# Patient Record
Sex: Male | Born: 1955 | Race: White | Hispanic: No | Marital: Married | Smoking: Former smoker
Health system: Southern US, Community
[De-identification: ages and names within clinical notes are randomized; demographics above are authoritative.]

## PROBLEM LIST (undated history)

## (undated) DIAGNOSIS — E785 Hyperlipidemia, unspecified: Secondary | ICD-10-CM

## (undated) DIAGNOSIS — J309 Allergic rhinitis, unspecified: Secondary | ICD-10-CM

## (undated) HISTORY — DX: Allergic rhinitis, unspecified: J30.9

## (undated) HISTORY — DX: Hyperlipidemia, unspecified: E78.5

## (undated) HISTORY — PX: WISDOM TOOTH EXTRACTION: SHX21

---

## 2001-01-25 ENCOUNTER — Other Ambulatory Visit: Admission: RE | Admit: 2001-01-25 | Discharge: 2001-01-25 | Payer: Self-pay | Admitting: Urology

## 2004-04-26 HISTORY — PX: CYST REMOVAL NECK: SHX6281

## 2011-01-18 ENCOUNTER — Ambulatory Visit (INDEPENDENT_AMBULATORY_CARE_PROVIDER_SITE_OTHER): Payer: BC Managed Care – PPO | Admitting: *Deleted

## 2011-01-18 DIAGNOSIS — M7989 Other specified soft tissue disorders: Secondary | ICD-10-CM

## 2011-01-21 NOTE — Procedures (Unsigned)
DUPLEX DEEP VENOUS EXAM - LOWER EXTREMITY  INDICATION:  Rule out DVT.  HISTORY:  Edema:  Yes. Trauma/Surgery:  No. Pain:  No. PE:  No. Previous DVT:  No. Anticoagulants:  No. Other:  Cellulitis and redness of the leg.  DUPLEX EXAM:               CFV   SFV   PopV  PTV    GSV               R  L  R  L  R  L  R   L  R  L Thrombosis    o  o     o     o      o     o Spontaneous   +  +     +     +      +     + Phasic        +  +     +     +      +     + Augmentation  +  +     +     +      +     + Compressible  +  +     +     +      +     + Competent     +  +     +     +      +     +  Legend:  + - yes  o - no  p - partial  D - decreased  IMPRESSION:  No evidence of deep venous thrombosis was noted.  Preliminary results were called to Dr. Laurey Morale office.   _____________________________ Di Kindle. Edilia Bo, M.D.  EM/MEDQ  D:  01/18/2011  T:  01/18/2011  Job:  [id number]

## 2011-08-25 HISTORY — PX: TRANSTHORACIC ECHOCARDIOGRAM: SHX275

## 2012-12-20 LAB — IFOBT (OCCULT BLOOD): IFOBT: NEGATIVE

## 2013-07-19 ENCOUNTER — Encounter: Payer: Self-pay | Admitting: *Deleted

## 2013-07-24 ENCOUNTER — Encounter: Payer: Self-pay | Admitting: Internal Medicine

## 2013-07-25 ENCOUNTER — Ambulatory Visit (INDEPENDENT_AMBULATORY_CARE_PROVIDER_SITE_OTHER): Payer: BC Managed Care – PPO | Admitting: Internal Medicine

## 2013-07-25 ENCOUNTER — Encounter: Payer: Self-pay | Admitting: Internal Medicine

## 2013-07-25 VITALS — BP 108/72 | HR 50 | Ht 73.0 in | Wt 179.8 lb

## 2013-07-25 DIAGNOSIS — R9431 Abnormal electrocardiogram [ECG] [EKG]: Secondary | ICD-10-CM

## 2013-07-25 DIAGNOSIS — I2510 Atherosclerotic heart disease of native coronary artery without angina pectoris: Secondary | ICD-10-CM

## 2013-07-25 DIAGNOSIS — R931 Abnormal findings on diagnostic imaging of heart and coronary circulation: Secondary | ICD-10-CM

## 2013-07-25 DIAGNOSIS — E785 Hyperlipidemia, unspecified: Secondary | ICD-10-CM

## 2013-07-25 NOTE — Patient Instructions (Signed)
Your physician has requested that you have an exercise tolerance test. For further information please visit [email or web address]. Please also follow instruction sheet, as given.  Your physician recommends that you schedule a follow-up appointment in: 2-3 weeks with Dr. Debara Pickett

## 2013-07-26 ENCOUNTER — Telehealth (HOSPITAL_COMMUNITY): Payer: Self-pay

## 2013-07-31 ENCOUNTER — Ambulatory Visit (HOSPITAL_COMMUNITY)
Admission: RE | Admit: 2013-07-31 | Discharge: 2013-07-31 | Disposition: A | Discharge status: Home / Self Care | Payer: BC Managed Care – PPO | Source: Ambulatory Visit | Attending: Internal Medicine | Admitting: Internal Medicine

## 2013-07-31 DIAGNOSIS — R9431 Abnormal electrocardiogram [ECG] [EKG]: Secondary | ICD-10-CM

## 2013-07-31 DIAGNOSIS — R931 Abnormal findings on diagnostic imaging of heart and coronary circulation: Secondary | ICD-10-CM

## 2013-08-01 ENCOUNTER — Telehealth: Payer: Self-pay | Admitting: *Deleted

## 2013-08-01 ENCOUNTER — Encounter: Payer: Self-pay | Admitting: Internal Medicine

## 2013-08-01 DIAGNOSIS — E785 Hyperlipidemia, unspecified: Secondary | ICD-10-CM | POA: Insufficient documentation

## 2013-08-01 DIAGNOSIS — R931 Abnormal findings on diagnostic imaging of heart and coronary circulation: Secondary | ICD-10-CM | POA: Insufficient documentation

## 2013-08-01 NOTE — Progress Notes (Signed)
OFFICE NOTE  Chief Complaint:  Abnormal coronary calcium  Primary Care Physician: Jerlyn Ly, MD  HPI:  Connor Chung is a very pleasant 36 showed male kindly referred to me by Dr. Joylene Draft.  He has a history of dyslipidemia, a family history of coronary disease and dyslipidemia and recently has been having troubles with microscopic hematuria. He underwent a CT scan of the abdomen and pelvis on 06/12/2013 through [facility]. This did not demonstrate any significant abnormalities in the kidneys, however there was coronary atherosclerosis including calcification of the right coronary artery which was incidentally noted. Apparently Connor Chung has a history of septal Q waves seen on his ECG and underwent an echocardiogram which was performed in our office on 09/17/2011. This demonstrated normal systolic and diastolic function. There was mild mitral regurgitation and mild left atrial enlargement. There is borderline aortic root dilatation at 3.9 cm.  He is on Zocor 40 mg daily for his dyslipidemia which is well controlled. His LDL particle number is 965, HDL PD 38-LDL size 21 nmol, total cholesterol 173, triglycerides 71, HDL 55 and LDL 104..  he is active and asymptomatic, denying any chest pain or shortness of breath or limitations to activity.  PMHx:  Past Medical History  Diagnosis Date  . Hyperlipidemia     Past Surgical History  Procedure Laterality Date  . Wisdom tooth extraction    . Transthoracic echocardiogram  08/2011    EF=>55%, borderline LA enlargement, RA mildly dilated, mild MR, trace TR, borderline aortic root dilatation     FAMHx:  Family History  Problem Relation Age of Onset  . Cancer Mother     skin   . Cancer Father     skin   . Heart Problems Maternal Grandmother     SOCHx:   reports that he quit smoking about 25 years ago. He has never used smokeless tobacco. He reports that he drinks alcohol. He reports that he does not use illicit drugs.  ALLERGIES:   No Known Allergies  ROS: A comprehensive review of systems was negative except for: Genitourinary: positive for hematuria  HOME MEDS: Current Outpatient Prescriptions  Medication Sig Dispense Refill  . cholecalciferol (VITAMIN D) 1000 UNITS tablet Take 2,000 Units by mouth daily.      . fluticasone (FLONASE) 50 MCG/ACT nasal spray Place 2 sprays into both nostrils daily.      . Omega-3 Fatty Acids (FISH OIL) 1200 MG CAPS Take 1 capsule by mouth daily.      . simvastatin (ZOCOR) 40 MG tablet Take 40 mg by mouth daily.       No current facility-administered medications for this visit.    LABS/IMAGING: No results found for this or any previous visit (from the past 48 hour(s)). No results found.  VITALS: BP 108/72  Pulse 50  Ht 6\' 1"  (1.854 m)  Wt 179 lb 12.8 oz (81.557 kg)  BMI 23.73 kg/m2  EXAM: General appearance: alert and no distress Neck: no carotid bruit and no JVD Lungs: clear to auscultation bilaterally Heart: regular rate and rhythm, S1, S2 normal, no murmur, click, rub or gallop Abdomen: soft, non-tender; bowel sounds normal; no masses,  no organomegaly Extremities: extremities normal, atraumatic, no cyanosis or edema Pulses: 2+ and symmetric Skin: Skin color, texture, turgor normal. No rashes or lesions Neurologic: Grossly normal Psych: Mood, affect normal  EKG: Sinus bradycardia at 50, incomplete right bundle pattern  ASSESSMENT: 1. Abnormal right coronary artery calcium 2. Dyslipidemia-well controlled 3. 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   Connor Chung is asymptomatic from a cardiac standpoint. He does have right coronary artery calcium, but is well treated with regards to his dyslipidemia. It very well may be that his coronary artery calcium develop prior to aggressive treatment of his dyslipidemia. His EKG does not indicate any prior MI. I believe his echocardiogram is reassuring from 2013  with normal wall motion, systolic and diastolic dysfunction. A functional assessment with exercise stress testing to evaluate if his right coronary artery calcium is associated with any stenosis it is reasonable testing. If he does well and this is low risk, I would continue with aggressive medical therapy including targets for the secondary prevention of coronary disease.  Thank you so much for the kind referral.  Pixie Casino, MD, [facility] Attending Cardiologist [place] 08/01/2013, 7:04 PM

## 2013-08-01 NOTE — Telephone Encounter (Signed)
Left message with normal exercise tolerance test results. Reminded of OV 5/1

## 2013-08-24 ENCOUNTER — Telehealth: Payer: Self-pay | Admitting: Internal Medicine

## 2013-08-24 ENCOUNTER — Encounter: Payer: Self-pay | Admitting: Internal Medicine

## 2013-08-24 ENCOUNTER — Ambulatory Visit (INDEPENDENT_AMBULATORY_CARE_PROVIDER_SITE_OTHER): Payer: BC Managed Care – PPO | Admitting: Internal Medicine

## 2013-08-24 VITALS — BP 112/62 | HR 56 | Ht 73.0 in | Wt 182.0 lb

## 2013-08-24 DIAGNOSIS — R931 Abnormal findings on diagnostic imaging of heart and coronary circulation: Secondary | ICD-10-CM

## 2013-08-24 DIAGNOSIS — E785 Hyperlipidemia, unspecified: Secondary | ICD-10-CM

## 2013-08-24 DIAGNOSIS — I2510 Atherosclerotic heart disease of native coronary artery without angina pectoris: Secondary | ICD-10-CM

## 2013-08-24 NOTE — Patient Instructions (Signed)
Your physician recommends that you schedule a follow-up appointment as needed  

## 2013-08-24 NOTE — Telephone Encounter (Signed)
Have a question. Want to know does he have any limitations on his physical activities and may he go mountain climbing. Please Call   Thanks

## 2013-08-24 NOTE — Progress Notes (Signed)
OFFICE NOTE  Chief Complaint:  Abnormal coronary calcium  Primary Care Physician: Connor Ly, MD  HPI:  Connor Chung is a very pleasant 37 showed male kindly referred to me by Dr. Joylene Chung.  He has a history of dyslipidemia, a family history of coronary disease and dyslipidemia and recently has been having troubles with microscopic hematuria. He underwent a CT scan of the abdomen and pelvis on 06/12/2013 through [facility]. This did not demonstrate any significant abnormalities in the kidneys, however there was coronary atherosclerosis including calcification of the right coronary artery which was incidentally noted. Apparently Connor Chung has a history of septal Q waves seen on his ECG and underwent an echocardiogram which was performed in our office on 09/17/2011. This demonstrated normal systolic and diastolic function. There was mild mitral regurgitation and mild left atrial enlargement. There is borderline aortic root dilatation at 3.9 cm.  He is on Zocor 40 mg daily for his dyslipidemia which is well controlled. His LDL particle number is 965, HDL PD 38-LDL size 21 nmol, total cholesterol 173, triglycerides 71, HDL 55 and LDL 104..  he is active and asymptomatic, denying any chest pain or shortness of breath or limitations to activity.  Connor Chung returns today for followup of his exercise tolerance test. He did very well with the test exercising for 15 minutes and 17 METS. There were no EKG changes noted. He had no chest pain.  PMHx:  Past Medical History  Diagnosis Date  . Hyperlipidemia     Past Surgical History  Procedure Laterality Date  . Wisdom tooth extraction    . Transthoracic echocardiogram  08/2011    EF=>55%, borderline LA enlargement, RA mildly dilated, mild MR, trace TR, borderline aortic root dilatation     FAMHx:  Family History  Problem Relation Age of Onset  . Cancer Mother     skin   . Cancer Father     skin   . Heart Problems Maternal  Grandmother     SOCHx:   reports that he quit smoking about 25 years ago. He has never used smokeless tobacco. He reports that he drinks alcohol. He reports that he does not use illicit drugs.  ALLERGIES:  No Known Allergies  ROS: A comprehensive review of systems was negative except for: Genitourinary: positive for hematuria  HOME MEDS: Current Outpatient Prescriptions  Medication Sig Dispense Refill  . cholecalciferol (VITAMIN D) 1000 UNITS tablet Take 2,000 Units by mouth daily.      . fluticasone (FLONASE) 50 MCG/ACT nasal spray Place 2 sprays into both nostrils daily.      . Omega-3 Fatty Acids (FISH OIL) 1200 MG CAPS Take 1 capsule by mouth daily.      . simvastatin (ZOCOR) 40 MG tablet Take 40 mg by mouth daily.       No current facility-administered medications for this visit.    LABS/IMAGING: No results found for this or any previous visit (from the past 48 hour(s)). No results found.  VITALS: BP 112/62  Pulse 56  Ht 6\' 1"  (1.854 m)  Wt 182 lb (82.555 kg)  BMI 24.02 kg/m2  EXAM: deferred  EKG: deferred  ASSESSMENT: 1. Abnormal right coronary artery calcium - low risk Bruce treadmill stress test 2. Dyslipidemia-well controlled 3. Incomplete right bundle-not septal Q waves, which require Q waves to be present in V1-V3  PLAN: 1.   Connor Chung is asymptomatic from a cardiac standpoint. He does have right coronary artery calcium, but is  well treated with regards to his dyslipidemia. He was able to exercise on the Bruce treadmill stress test to a high level and was asymptomatic without any significant EKG changes concerning for ischemia. This is very reassuring. Advised to continue to aggressively treat his cholesterol and modify his risk factors, he will likely not have problems with coronary disease. I'm happy to see him back as needed, but I will return him to your excellent care.  Thank you so much for the kind referral.  Connor Casino, MD, [facility] Attending  Cardiologist [place] 08/24/2013, 9:12 AM

## 2013-08-24 NOTE — Telephone Encounter (Signed)
Message forwarded to Dr. Hilty.  

## 2013-08-26 NOTE — Telephone Encounter (Signed)
No restrictions whatsoever. That's why we got the stress test and it was negative.  Dr. Lemmie Evens

## 2013-08-27 NOTE — Telephone Encounter (Signed)
Returned call and informed pt per instructions by MD.  Pt verbalized understanding and agreed w/ plan.  

## 2013-10-25 NOTE — Telephone Encounter (Signed)
Encounter complete. 

## 2014-05-28 ENCOUNTER — Other Ambulatory Visit (HOSPITAL_COMMUNITY)
Admission: RE | Admit: 2014-05-28 | Discharge: 2014-05-28 | Disposition: A | Discharge status: Home / Self Care | Payer: BLUE CROSS/BLUE SHIELD | Source: Ambulatory Visit | Attending: Otolaryngology | Admitting: Otolaryngology

## 2014-05-28 DIAGNOSIS — D110 Benign neoplasm of parotid gland: Secondary | ICD-10-CM | POA: Diagnosis not present

## 2014-07-12 ENCOUNTER — Telehealth: Payer: Self-pay | Admitting: Internal Medicine

## 2014-07-12 NOTE — Telephone Encounter (Signed)
Ok .. Go ahead and schedule an appointment with me. Ok to [employer].  DR. Lemmie Evens

## 2014-07-12 NOTE — Telephone Encounter (Signed)
Forward to Dr Debara Pickett AND Eliezer Lofts RN Copy of EKG,not located on either fax machine

## 2014-07-12 NOTE — Telephone Encounter (Signed)
Pt called in stating that he would like to be seen by Dr. Debara Pickett as soon as possible because he was set to have GI surgery but once the surgeon took an EKG , it showed some irregularities with his heart beat and wanted him to be seen before proceeding with the surgery. Pt stated that he will be faxing over a copy of the EKG to the attn of [place]. Please f/u with pt  Thanks

## 2014-07-12 NOTE — Telephone Encounter (Signed)
Spoke with patient. He states he can come in to see Dr. Debara Pickett on Tuesday March 22nd - OK to double book.   Appointment scheduled for 3/22 @ 3pm - patient aware  Patient will bring EKG with him to appointment & will fax to our office

## 2014-07-15 ENCOUNTER — Telehealth (HOSPITAL_COMMUNITY): Payer: Self-pay | Admitting: Internal Medicine

## 2014-07-15 NOTE — Telephone Encounter (Signed)
Received records from [facility] for appointment with Dr Debara Pickett on 07/16/14.  Records given to [facility] (medical records) for Dr Lysbeth Penner schedule on 07/16/14. lp

## 2014-07-15 NOTE — Telephone Encounter (Signed)
Close encounter 

## 2014-07-15 NOTE — Telephone Encounter (Signed)
Patient notified that Dr. Debara Pickett reviewed EKG and it was same as last year but HR was slightly slower. Patient reports he feels he may be in better shape than he was last year. Patient will keep f/up on 3/22 for surgical clearance

## 2014-07-16 ENCOUNTER — Ambulatory Visit (INDEPENDENT_AMBULATORY_CARE_PROVIDER_SITE_OTHER): Payer: BLUE CROSS/BLUE SHIELD | Admitting: Internal Medicine

## 2014-07-16 ENCOUNTER — Encounter: Payer: Self-pay | Admitting: Internal Medicine

## 2014-07-16 VITALS — BP 116/80 | HR 64 | Ht 73.0 in | Wt 176.0 lb

## 2014-07-16 DIAGNOSIS — E785 Hyperlipidemia, unspecified: Secondary | ICD-10-CM

## 2014-07-16 DIAGNOSIS — R931 Abnormal findings on diagnostic imaging of heart and coronary circulation: Secondary | ICD-10-CM

## 2014-07-16 DIAGNOSIS — I2510 Atherosclerotic heart disease of native coronary artery without angina pectoris: Secondary | ICD-10-CM

## 2014-07-16 DIAGNOSIS — R001 Bradycardia, unspecified: Secondary | ICD-10-CM

## 2014-07-16 NOTE — Progress Notes (Signed)
OFFICE NOTE  Chief Complaint:  Abnormal coronary calcium  Primary Care Physician: Jerlyn Ly, MD  HPI:  Connor Chung is a very pleasant 67 showed male kindly referred to me by Dr. Joylene Draft.  He has a history of dyslipidemia, a family history of coronary disease and dyslipidemia and recently has been having troubles with microscopic hematuria. He underwent a CT scan of the abdomen and pelvis on 06/12/2013 through [facility]. This did not demonstrate any significant abnormalities in the kidneys, however there was coronary atherosclerosis including calcification of the right coronary artery which was incidentally noted. Apparently Connor Chung has a history of septal Q waves seen on his ECG and underwent an echocardiogram which was performed in our office on 09/17/2011. This demonstrated normal systolic and diastolic function. There was mild mitral regurgitation and mild left atrial enlargement. There is borderline aortic root dilatation at 3.9 cm.  He is on Zocor 40 mg daily for his dyslipidemia which is well controlled. His LDL particle number is 965, HDL PD 38-LDL size 21 nmol, total cholesterol 173, triglycerides 71, HDL 55 and LDL 104..  he is active and asymptomatic, denying any chest pain or shortness of breath or limitations to activity.  Connor Chung returns today for followup of his exercise tolerance test. He did very well with the test exercising for 15 minutes and 17 METS. There were no EKG changes noted. He had no chest pain.  Connor Chung was referred back to me today for evaluation of an abnormal EKG. He underwent preoperative testing and an EKG showed marked sinus bradycardia with a PAC. There was concern for possible septal infarct and incomplete right bundle branch block. I compared his EKG with his prior EKG from the office and there is essentially no change other than a heart rate slightly lower. It turns out that he's had some bradycardia in the past which is a general sinus  bradycardia. That being said, as above he exercised quite a lot on his exercise tolerance test without any symptoms. He continues to have no symptoms in fact he has better exercise tolerance now that he's been more regularly exercising that he had when I saw him previously.  PMHx:  Past Medical History  Diagnosis Date  . Hyperlipidemia     Past Surgical History  Procedure Laterality Date  . Wisdom tooth extraction    . Transthoracic echocardiogram  08/2011    EF=>55%, borderline LA enlargement, RA mildly dilated, mild MR, trace TR, borderline aortic root dilatation     FAMHx:  Family History  Problem Relation Age of Onset  . Cancer Mother     skin   . Cancer Father     skin   . Heart Problems Maternal Grandmother     SOCHx:   reports that he quit smoking about 26 years ago. He has never used smokeless tobacco. He reports that he drinks alcohol. He reports that he does not use illicit drugs.  ALLERGIES:  No Known Allergies  ROS: A comprehensive review of systems was negative.  HOME MEDS: Current Outpatient Prescriptions  Medication Sig Dispense Refill  . cholecalciferol (VITAMIN D) 1000 UNITS tablet Take 2,000 Units by mouth daily.    . fluticasone (FLONASE) 50 MCG/ACT nasal spray Place 2 sprays into both nostrils daily.    . Omega-3 Fatty Acids (FISH OIL) 1200 MG CAPS Take 1 capsule by mouth daily.    . simvastatin (ZOCOR) 40 MG tablet Take 40 mg by mouth daily.  No current facility-administered medications for this visit.    LABS/IMAGING: No results found for this or any previous visit (from the past 48 hour(s)). No results found.  VITALS: BP 116/80 mmHg  Pulse 64  Ht 6\' 1"  (1.854 m)  Wt 176 lb (79.833 kg)  BMI 23.23 kg/m2  EXAM: General appearance: alert and no distress Lungs: clear to auscultation bilaterally Heart: regular rate and rhythm, S1, S2 normal, no murmur, click, rub or gallop Extremities: extremities normal, atraumatic, no cyanosis or  edema  EKG: deferred  ASSESSMENT: 1. Abnormal right coronary artery calcium - low risk Bruce treadmill stress test 2. Dyslipidemia-well controlled 3. Incomplete right bundle-not septal Q waves, which require Q waves to be present in V1-V3 4. Asymptomatic sinus bradycardia 5. Low risk for upcoming surgery  PLAN: 1.   Connor Chung is asymptomatic from a cardiac standpoint. He does have right coronary artery calcium, but is well treated with regards to his dyslipidemia. He was able to exercise on the Bruce treadmill stress test to a high level and was asymptomatic without any significant EKG changes concerning for ischemia. He has had intermittent bradycardia which is suspect is due to vagal tone, but he has never been symptomatic. It's very unlikely that he'll ever require a pacemaker for this. From a cardiac standpoint, he is not at increased risk for surgery and I would categorize him as low risk for his upcoming surgery.  Thank you so much for the kind referral.  Pixie Casino, MD, [facility] Attending Cardiologist [facility]  Kysean Sweet C 07/16/2014, 5:24 PM

## 2014-07-16 NOTE — Patient Instructions (Signed)
Your physician recommends that you schedule a follow-up appointment as needed  

## 2014-07-17 ENCOUNTER — Encounter: Payer: Self-pay | Admitting: Internal Medicine

## 2014-10-21 ENCOUNTER — Encounter: Payer: Self-pay | Admitting: Gastroenterology

## 2014-10-25 HISTORY — PX: OTHER SURGICAL HISTORY: SHX169

## 2015-01-09 ENCOUNTER — Encounter: Payer: BLUE CROSS/BLUE SHIELD | Admitting: Gastroenterology

## 2015-02-17 ENCOUNTER — Ambulatory Visit (AMBULATORY_SURGERY_CENTER): Payer: Self-pay | Admitting: *Deleted

## 2015-02-17 VITALS — Ht 73.0 in | Wt 168.2 lb

## 2015-02-17 DIAGNOSIS — Z860100 Personal history of colon polyps, unspecified: Secondary | ICD-10-CM

## 2015-02-17 MED ORDER — NA SULFATE-K SULFATE-MG SULF 17.5-3.13-1.6 GM/177ML PO SOLN: ORAL | Status: DC

## 2015-02-17 NOTE — Progress Notes (Signed)
No allergies to eggs or soy. No problems with anesthesia.  Pt given Emmi instructions for colonoscopy  No oxygen use  No diet drug use  

## 2015-02-25 ENCOUNTER — Encounter: Payer: Self-pay | Admitting: Gastroenterology

## 2015-02-25 ENCOUNTER — Ambulatory Visit (AMBULATORY_SURGERY_CENTER): Payer: BLUE CROSS/BLUE SHIELD | Admitting: Gastroenterology

## 2015-02-25 VITALS — BP 131/75 | HR 54 | Temp 95.8°F | Resp 12 | Ht 73.0 in | Wt 168.0 lb

## 2015-02-25 DIAGNOSIS — Z86010 Personal history of colonic polyps: Secondary | ICD-10-CM | POA: Diagnosis not present

## 2015-02-25 DIAGNOSIS — D125 Benign neoplasm of sigmoid colon: Secondary | ICD-10-CM

## 2015-02-25 DIAGNOSIS — K635 Polyp of colon: Secondary | ICD-10-CM | POA: Diagnosis not present

## 2015-02-25 MED ORDER — SODIUM CHLORIDE 0.9 % IV SOLN: 500.0000 mL | INTRAVENOUS | Status: DC

## 2015-02-25 NOTE — Op Note (Signed)
[place]  [address] [place], [address]   COLONOSCOPY PROCEDURE REPORT  PATIENT: Connor Chung, Connor Chung  MR#: [id number] BIRTHDATE: Jul 29, 1955 , 7  yrs. old GENDER: male ENDOSCOPIST: Harl Bowie, MD REFERRED QI:WLNL Perini, M.D. PROCEDURE DATE:  02/25/2015 PROCEDURE:   Colonoscopy, surveillance and Colonoscopy with cold biopsy polypectomy First Screening Colonoscopy - Avg.  risk and is 50 yrs.  old or older - No.  Prior Negative Screening - Now for repeat screening. N/A  History of Adenoma - Now for follow-up colonoscopy & has been > or = to 3 yrs.  N/A  Polyps removed today? Yes ASA CLASS:   Class II INDICATIONS:Surveillance due to prior colonic neoplasia and Colorectal Neoplasm Risk Assessment for this procedure is average risk. MEDICATIONS: Propofol 350 mg IV and Lidocaine 40 mg IV  DESCRIPTION OF PROCEDURE:   After the risks benefits and alternatives of the procedure were thoroughly explained, informed consent was obtained.  The digital rectal exam revealed no abnormalities of the rectum.   The [id number]  endoscope was introduced through the anus and advanced to the cecum, which was identified by both the appendix and ileocecal valve. No adverse events experienced.   The quality of the prep was good.  The instrument was then slowly withdrawn as the colon was fully examined. Estimated blood loss is zero unless otherwise noted in this procedure report.   COLON FINDINGS: Sigmoid diverticulosis. 46mm sessile polyp, inflamed near a diverticula in sigmoid colon removed by cold biopsy forceps. Small internal hemorrhoids.  Retroflexed views revealed internal hemorrhoids. The time to cecum = 6.1 Withdrawal time = 8.9   The scope was withdrawn and the procedure completed. COMPLICATIONS: There were no immediate complications.  ENDOSCOPIC IMPRESSION: Sigmoid diverticulosis Small internal hemorrhoids Small sessile polyp removed  RECOMMENDATIONS: 1.   Await pathology results 2.  If the polyp(s) removed today are proven to be adenomatous (pre-cancerous) polyps, you will need a repeat colonoscopy in 5 years.  Otherwise you should continue to follow colorectal cancer screening guidelines for "routine risk" patients with colonoscopy in 10 years.  You will receive a letter within 1-2 weeks with the results of your biopsy as well as final recommendations.  Please call my office if you have not received a letter after 3 weeks.  eSigned:  Harl Bowie, MD 02/25/2015 12:04 PM

## 2015-02-25 NOTE — Progress Notes (Signed)
Stable to RR 

## 2015-02-25 NOTE — Progress Notes (Signed)
Called to room to assist during endoscopic procedure.  Patient ID and intended procedure confirmed with present staff. Received instructions for my participation in the procedure from the performing physician.  

## 2015-02-25 NOTE — Patient Instructions (Signed)
YOU HAD AN ENDOSCOPIC PROCEDURE TODAY AT [facility]:   Refer to the procedure report that was given to you for any specific questions about what was found during the examination.  If the procedure report does not answer your questions, please call your gastroenterologist to clarify.  If you requested that your care partner not be given the details of your procedure findings, then the procedure report has been included in a sealed envelope for you to review at your convenience later.  YOU SHOULD EXPECT: Some feelings of bloating in the abdomen. Passage of more gas than usual.  Walking can help get rid of the air that was put into your GI tract during the procedure and reduce the bloating. If you had a lower endoscopy (such as a colonoscopy or flexible sigmoidoscopy) you may notice spotting of blood in your stool or on the toilet paper. If you underwent a bowel prep for your procedure, you may not have a normal bowel movement for a few days.  Please Note:  You might notice some irritation and congestion in your nose or some drainage.  This is from the oxygen used during your procedure.  There is no need for concern and it should clear up in a day or so.  SYMPTOMS TO REPORT IMMEDIATELY:   Following lower endoscopy (colonoscopy or flexible sigmoidoscopy):  Excessive amounts of blood in the stool  Significant tenderness or worsening of abdominal pains  Swelling of the abdomen that is new, acute  Fever of 100F or higher   For urgent or emergent issues, a gastroenterologist can be reached at any hour by calling [phone].   DIET: Your first meal following the procedure should be a small meal and then it is ok to progress to your normal diet. Heavy or fried foods are harder to digest and may make you feel nauseous or bloated.  Likewise, meals heavy in dairy and vegetables can increase bloating.  Drink plenty of fluids but you should avoid alcoholic beverages for 24  hours.  ACTIVITY:  You should plan to take it easy for the rest of today and you should NOT DRIVE or use heavy machinery until tomorrow (because of the sedation medicines used during the test).    FOLLOW UP: Our staff will call the number listed on your records the next business day following your procedure to check on you and address any questions or concerns that you may have regarding the information given to you following your procedure. If we do not reach you, we will leave a message.  However, if you are feeling well and you are not experiencing any problems, there is no need to return our call.  We will assume that you have returned to your regular daily activities without incident.  If any biopsies were taken you will be contacted by phone or by letter within the next 1-3 weeks.  Please call us at [phone] if you have not heard about the biopsies in 3 weeks.    SIGNATURES/CONFIDENTIALITY: You and/or your care partner have signed paperwork which will be entered into your electronic medical record.  These signatures attest to the fact that that the information above on your After Visit Summary has been reviewed and is understood.  Full responsibility of the confidentiality of this discharge information lies with you and/or your care-partner.  Polyps-handouts given

## 2015-02-26 ENCOUNTER — Telehealth: Payer: Self-pay

## 2015-02-26 NOTE — Telephone Encounter (Signed)
  Follow up Call-  Call back number 02/25/2015  Post procedure Call Back phone  # [phone]  Permission to leave phone message Yes     Patient questions:  Do you have a fever, pain , or abdominal swelling? No. Pain Score  0 *  Have you tolerated food without any problems? Yes.    Have you been able to return to your normal activities? Yes.    Do you have any questions about your discharge instructions: Diet   No. Medications  No. Follow up visit  No.  Do you have questions or concerns about your Care? No.  Actions: * If pain score is 4 or above: No action needed, pain <4.  No problems per the pt. maw

## 2015-03-18 ENCOUNTER — Encounter: Payer: BLUE CROSS/BLUE SHIELD | Admitting: Gastroenterology

## 2015-03-27 ENCOUNTER — Encounter: Payer: Self-pay | Admitting: Gastroenterology

## 2015-04-27 HISTORY — PX: COLONOSCOPY: SHX174

## 2015-09-25 ENCOUNTER — Observation Stay (HOSPITAL_COMMUNITY): Payer: Managed Care, Other (non HMO)

## 2015-09-25 ENCOUNTER — Encounter (HOSPITAL_COMMUNITY): Payer: Self-pay | Admitting: Emergency Medicine

## 2015-09-25 ENCOUNTER — Observation Stay (HOSPITAL_COMMUNITY)
Admission: EM | Admit: 2015-09-25 | Discharge: 2015-09-26 | Disposition: A | Discharge status: Home / Self Care | Payer: Managed Care, Other (non HMO) | Attending: Internal Medicine | Admitting: Internal Medicine

## 2015-09-25 ENCOUNTER — Emergency Department (HOSPITAL_COMMUNITY): Payer: Managed Care, Other (non HMO)

## 2015-09-25 DIAGNOSIS — I2510 Atherosclerotic heart disease of native coronary artery without angina pectoris: Secondary | ICD-10-CM | POA: Insufficient documentation

## 2015-09-25 DIAGNOSIS — Z8249 Family history of ischemic heart disease and other diseases of the circulatory system: Secondary | ICD-10-CM | POA: Diagnosis not present

## 2015-09-25 DIAGNOSIS — Y9239 Other specified sports and athletic area as the place of occurrence of the external cause: Secondary | ICD-10-CM | POA: Diagnosis not present

## 2015-09-25 DIAGNOSIS — T148XXA Other injury of unspecified body region, initial encounter: Secondary | ICD-10-CM

## 2015-09-25 DIAGNOSIS — Z7951 Long term (current) use of inhaled steroids: Secondary | ICD-10-CM | POA: Insufficient documentation

## 2015-09-25 DIAGNOSIS — Z79899 Other long term (current) drug therapy: Secondary | ICD-10-CM | POA: Insufficient documentation

## 2015-09-25 DIAGNOSIS — W1789XA Other fall from one level to another, initial encounter: Secondary | ICD-10-CM | POA: Diagnosis not present

## 2015-09-25 DIAGNOSIS — I429 Cardiomyopathy, unspecified: Secondary | ICD-10-CM | POA: Diagnosis not present

## 2015-09-25 DIAGNOSIS — E785 Hyperlipidemia, unspecified: Secondary | ICD-10-CM | POA: Insufficient documentation

## 2015-09-25 DIAGNOSIS — R001 Bradycardia, unspecified: Secondary | ICD-10-CM

## 2015-09-25 DIAGNOSIS — Z87891 Personal history of nicotine dependence: Secondary | ICD-10-CM | POA: Insufficient documentation

## 2015-09-25 DIAGNOSIS — R55 Syncope and collapse: Secondary | ICD-10-CM | POA: Diagnosis not present

## 2015-09-25 DIAGNOSIS — S0101XA Laceration without foreign body of scalp, initial encounter: Secondary | ICD-10-CM | POA: Diagnosis not present

## 2015-09-25 DIAGNOSIS — Y93A1 Activity, exercise machines primarily for cardiorespiratory conditioning: Secondary | ICD-10-CM | POA: Diagnosis not present

## 2015-09-25 DIAGNOSIS — D649 Anemia, unspecified: Secondary | ICD-10-CM | POA: Diagnosis present

## 2015-09-25 DIAGNOSIS — I2584 Coronary atherosclerosis due to calcified coronary lesion: Secondary | ICD-10-CM | POA: Diagnosis not present

## 2015-09-25 LAB — IRON AND TIBC
Iron: 100 ug/dL (ref 45–182)
Saturation Ratios: 30 % (ref 17.9–39.5)
TIBC: 329 ug/dL (ref 250–450)
UIBC: 229 ug/dL

## 2015-09-25 LAB — TSH: TSH: 3.206 u[IU]/mL (ref 0.350–4.500)

## 2015-09-25 LAB — CBC WITH DIFFERENTIAL/PLATELET
Basophils Absolute: 0 K/uL (ref 0.0–0.1)
Basophils Relative: 0 %
Eosinophils Absolute: 0.1 K/uL (ref 0.0–0.7)
Eosinophils Relative: 2 %
HCT: 36.6 % — ABNORMAL LOW (ref 39.0–52.0)
Hemoglobin: 12.3 g/dL — ABNORMAL LOW (ref 13.0–17.0)
Lymphocytes Relative: 28 %
Lymphs Abs: 1.4 K/uL (ref 0.7–4.0)
MCH: 30.1 pg (ref 26.0–34.0)
MCHC: 33.6 g/dL (ref 30.0–36.0)
MCV: 89.5 fL (ref 78.0–100.0)
Monocytes Absolute: 0.4 K/uL (ref 0.1–1.0)
Monocytes Relative: 9 %
Neutro Abs: 3.1 K/uL (ref 1.7–7.7)
Neutrophils Relative %: 61 %
Platelets: 153 K/uL (ref 150–400)
RBC: 4.09 MIL/uL — ABNORMAL LOW (ref 4.22–5.81)
RDW: 12.6 % (ref 11.5–15.5)
WBC: 5 K/uL (ref 4.0–10.5)

## 2015-09-25 LAB — COMPREHENSIVE METABOLIC PANEL WITH GFR
ALT: 30 U/L (ref 17–63)
AST: 29 U/L (ref 15–41)
Albumin: 3.5 g/dL (ref 3.5–5.0)
Alkaline Phosphatase: 60 U/L (ref 38–126)
Anion gap: 4 — ABNORMAL LOW (ref 5–15)
BUN: 24 mg/dL — ABNORMAL HIGH (ref 6–20)
CO2: 23 mmol/L (ref 22–32)
Calcium: 8.7 mg/dL — ABNORMAL LOW (ref 8.9–10.3)
Chloride: 110 mmol/L (ref 101–111)
Creatinine, Ser: 1.16 mg/dL (ref 0.61–1.24)
GFR calc Af Amer: >60 mL/min
GFR calc non Af Amer: >60 mL/min
Glucose, Bld: 103 mg/dL — ABNORMAL HIGH (ref 65–99)
Potassium: 4.6 mmol/L (ref 3.5–5.1)
Sodium: 137 mmol/L (ref 135–145)
Total Bilirubin: 0.8 mg/dL (ref 0.3–1.2)
Total Protein: 5.5 g/dL — ABNORMAL LOW (ref 6.5–8.1)

## 2015-09-25 LAB — RETICULOCYTES
RBC.: 4.39 MIL/uL (ref 4.22–5.81)
RETIC CT PCT: 1 % (ref 0.4–3.1)
Retic Count, Absolute: 43.9 10*3/uL (ref 19.0–186.0)

## 2015-09-25 LAB — I-STAT TROPONIN, ED: Troponin i, poc: 0.01 ng/mL (ref 0.00–0.08)

## 2015-09-25 LAB — D-DIMER, QUANTITATIVE (NOT AT ARMC)

## 2015-09-25 LAB — FERRITIN: Ferritin: 190 ng/mL (ref 24–336)

## 2015-09-25 LAB — VITAMIN B12: VITAMIN B 12: 271 pg/mL (ref 180–914)

## 2015-09-25 LAB — FOLATE: Folate: 13.1 ng/mL (ref >5.9)

## 2015-09-25 MED ORDER — SODIUM CHLORIDE 0.9% FLUSH
3.0000 mL | Freq: Two times a day (BID) | INTRAVENOUS | Status: DC
  Administered 2015-09-25 (×2): 3 mL via INTRAVENOUS

## 2015-09-25 MED ORDER — EZETIMIBE 10 MG PO TABS
5.0000 mg | ORAL_TABLET | Freq: Every day | ORAL | Status: DC
  Administered 2015-09-25: 5 mg via ORAL
  Filled 2015-09-25: qty 1

## 2015-09-25 MED ORDER — ACETAMINOPHEN 650 MG RE SUPP: 650.0000 mg | Freq: Four times a day (QID) | RECTAL | Status: DC | PRN

## 2015-09-25 MED ORDER — OMEGA-3-ACID ETHYL ESTERS 1 G PO CAPS
1.0000 g | ORAL_CAPSULE | Freq: Two times a day (BID) | ORAL | Status: DC
  Filled 2015-09-25: qty 1

## 2015-09-25 MED ORDER — SIMVASTATIN 40 MG PO TABS
40.0000 mg | ORAL_TABLET | Freq: Every day | ORAL | Status: DC
  Administered 2015-09-25: 40 mg via ORAL
  Filled 2015-09-25: qty 1

## 2015-09-25 MED ORDER — FLUTICASONE PROPIONATE 50 MCG/ACT NA SUSP
2.0000 | Freq: Every day | NASAL | Status: DC
  Filled 2015-09-25: qty 16

## 2015-09-25 MED ORDER — ACETAMINOPHEN 325 MG PO TABS
650.0000 mg | ORAL_TABLET | Freq: Four times a day (QID) | ORAL | Status: DC | PRN
Start: 2015-09-25 — End: 2015-09-26

## 2015-09-25 MED ORDER — VITAMIN D 1000 UNITS PO TABS: 2000.0000 [IU] | ORAL_TABLET | Freq: Every day | ORAL | Status: DC

## 2015-09-25 MED ORDER — ENOXAPARIN SODIUM 40 MG/0.4ML ~~LOC~~ SOLN
40.0000 mg | SUBCUTANEOUS | Status: DC
  Filled 2015-09-25: qty 0.4

## 2015-09-25 NOTE — ED Notes (Signed)
2 staples given to head laceration by EDP at this time. Patient tolerated well.

## 2015-09-25 NOTE — ED Notes (Signed)
Cardiology at bedside.

## 2015-09-25 NOTE — ED Provider Notes (Addendum)
CSN: [id number]     Arrival date & time 09/25/15  0801 History   First MD Initiated Contact with Patient 09/25/15 [phone]     Chief Complaint  Patient presents with  . Loss of Consciousness     (Consider location/radiation/quality/duration/timing/severity/associated sxs/prior Treatment) HPI Comments: Pt comes in with cc of syncope. Pt has hx of HL. He reports being at the gym and fainting while working out. Pt reports that he noted on the elliptical machine that his HR was not rising despite him bein on the machine for a while. He thought he was in terrific shape and thus more intensity was needed for him to get to the target rate, but then w/o any type of prodromal symptoms he fainted. Pt has a head lac right now. Admits to tick bites, as he is outdoors quite a bit.   ROS 10 Systems reviewed and are negative for acute change except as noted in the HPI.     Patient is a 60 y.o. male presenting with syncope. The history is provided by the patient.  Loss of Consciousness   Past Medical History  Diagnosis Date  . Hyperlipidemia    Past Surgical History  Procedure Laterality Date  . Wisdom tooth extraction    . Transthoracic echocardiogram  08/2011    EF=>55%, borderline LA enlargement, RA mildly dilated, mild MR, trace TR, borderline aortic root dilatation   . 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 Cyst removal neck  2006    right clavicale   Family History  Problem Relation Age of Onset  . Cancer Mother     skin   . Cancer Father     skin   . Heart Problems Maternal Grandmother   . Colon cancer Neg Hx    Social History  Substance Use Topics  . Smoking status: Former Smoker    Quit date: 07/19/1988  . Smokeless tobacco: Never Used  . Alcohol Use: 8.4 oz/week    14 Cans of beer per week    Review of Systems  Cardiovascular: Positive for syncope.      Allergies  Review of patient's allergies indicates no known allergies.  Home Medications   Prior  to Admission medications   Medication Sig Start Date End Date Taking? Authorizing Provider  cholecalciferol (VITAMIN D) 1000 UNITS tablet Take 2,000 Units by mouth daily.    Historical Provider, MD  ezetimibe (ZETIA) 10 MG tablet Take 10 mg by mouth daily. Takes one half tablet daily    Historical Provider, MD  fluticasone (FLONASE) 50 MCG/ACT nasal spray Place 2 sprays into both nostrils daily.    Historical Provider, MD  Omega-3 Fatty Acids (FISH OIL) 1200 MG CAPS Take 1 capsule by mouth daily.    Historical Provider, MD  simvastatin (ZOCOR) 40 MG tablet Take 40 mg by mouth daily.    Historical Provider, MD   BP 117/88 mmHg  Pulse 43  Temp(Src) 97.7 F (36.5 C) (Oral)  Resp 10  SpO2 100% Physical Exam  Constitutional: He is oriented to person, place, and time. He appears well-developed.  HENT:  Head: Normocephalic and atraumatic.  Eyes: Conjunctivae and EOM are normal. Pupils are equal, round, and reactive to light.  Neck: Normal range of motion. Neck supple.  Cardiovascular: Regular rhythm.   bradycardia  Pulmonary/Chest: Effort normal and breath sounds normal.  Abdominal: Soft. Bowel sounds are normal. He exhibits no distension. There is no tenderness. There is no rebound and no guarding.  Neurological: He is alert and oriented to person, place, and time.  Skin: Skin is warm.  Vitals reviewed.   ED Course  Procedures (including critical care time)  LACERATION REPAIR Performed by: Varney Biles Authorized by: Varney Biles Consent: Verbal consent obtained. Risks and benefits: risks, benefits and alternatives were discussed Consent given by: patient Patient identity confirmed: provided demographic data Prepped and Draped in normal sterile fashion Wound explored  Laceration Location: Scalp  Laceration Length: 3 cm  No Foreign Bodies seen or palpated  Anesthesia: local infiltration  Local anesthetic: EZ spray  Amount of cleaning: standard  Skin closure:  primary  Number of staples: 2  Technique: staples placed  Patient tolerance: Patient tolerated the procedure well with no immediate complications.   Labs Review Labs Reviewed  CBC WITH DIFFERENTIAL/PLATELET - Abnormal; Notable for the following:    RBC 4.09 (*)    Hemoglobin 12.3 (*)    HCT 36.6 (*)    All other components within normal limits  COMPREHENSIVE METABOLIC PANEL  I-STAT TROPOININ, ED    Imaging Review No results found. I have personally reviewed and evaluated these images and lab results as part of my medical decision-making.   EKG Interpretation   Date/Time:  Thursday September 25 2015 08:08:38 EDT Ventricular Rate:  45 PR Interval:  174 QRS Duration: 97 QT Interval:  468 QTC Calculation: 405 R Axis:   45 Text Interpretation:  Sinus bradycardia Probable anteroseptal infarct, old  T wves peaked No old tracing to compare No acute changes Confirmed by  Kathrynn Humble, MD, Thelma Comp [phone]) on 09/25/2015 8:15:29 AM Also confirmed by  Kathrynn Humble, MD, Thelma Comp [phone]), editor [place] CT, Leda Gauze [phone])  on 09/25/2015  8:33:47 AM      MDM   Final diagnoses:  Sinus bradycardia  Syncope and collapse    DDx includes: Orthostatic hypotension Stroke Vertebral artery dissection/stenosis Dysrhythmia PE Vasovagal/neurocardiogenic syncope Aortic stenosis Valvular disorder/Cardiomyopathy Anemia  PT comes in post syncope. No prodrome. Cardiology note shows that pt's HR was in the 50s and 60s on their evaluation. Pt had increased calcification on the RCA - which does support the SA note in majority of the patients. Pt is s/p neg stress. He has slightly peaked T in the v3-v4.  We will admit for syncope. Pt's HR in the 40s - so we will consult Cards as well. No AV blockage seen, and so despite the tick bites, we will not order Lyme disease titers from the ER.     Varney Biles, MD 09/25/15 [id number]  Varney Biles, MD 09/25/15 1108

## 2015-09-25 NOTE — Care Management Note (Signed)
Case Management Note  Patient Details  Name: Devontae Wierman MRN: [id number] Date of Birth: [birth year]  Subjective/Objective:                  60 yo pt comes in with cc of syncope.Pt has hx of HL. He reports being at the gym and fainting while working out. Epifania Gore with spouse.  Action/Plan: Follow for disposition needs.   Expected Discharge Date:  09/27/15               Expected Discharge Plan:  Home/Self Care  In-House Referral:  NA  Discharge planning Services  CM Consult  Post Acute Care Choice:    Choice offered to:     DME Arranged:    DME Agency:     HH Arranged:    HH Agency:     Status of Service:  In process, will continue to follow  Medicare Important Message Given:    Date Medicare IM Given:    Medicare IM give by:    Date Additional Medicare IM Given:    Additional Medicare Important Message give by:     If discussed at [place] of Stay Meetings, dates discussed:    Additional Comments:  Fuller Mandril, RN 09/25/2015, 10:43 AM

## 2015-09-25 NOTE — Progress Notes (Addendum)
D dimer negative. CT head in process.  1118 am: CT head with Left parietal scalp hematoma but no intracranial abnormalities  Erin Hearing, ANP

## 2015-09-25 NOTE — ED Notes (Signed)
Ct results available, pt to be transported upstairs at this time

## 2015-09-25 NOTE — ED Notes (Signed)
Attempted report 

## 2015-09-25 NOTE — ED Notes (Signed)
MD at bedside. 

## 2015-09-25 NOTE — Consult Note (Signed)
Patient ID: Connor Chung MRN: [id number], DOB/AGE: 58/07/1955   Admit date: 09/25/2015   Reason for Consult: Syncope and Bradycardia Requesting MD: Dr. Varney Biles    Primary Physician: Jerlyn Ly, MD Primary Cardiologist: Dr. Debara Pickett  Pt. Profile:  60 year old male with history of HLD, family h/o CAD, sinus bradycardia and incidental findings of RCA coronary calcifications on CT scan in 2015, however low risk exercise tolerance test and normal 2-D echocardiogram in 2016, presenting to the [facility] ED for evaluation for syncope in the setting of bradycardia.  Problem List  Past Medical History  Diagnosis Date  . Hyperlipidemia     Past Surgical History  Procedure Laterality Date  . Wisdom tooth extraction    . Transthoracic echocardiogram  08/2011    EF=>55%, borderline LA enlargement, RA mildly dilated, mild MR, trace TR, borderline aortic root dilatation   . 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 Cyst removal neck  2006    right clavicale     Allergies  No Known Allergies  HPI   The patient is a 60 year old male, presenting to the [facility] emergency department for evaluation for syncope in the setting of bradycardia.  He has been seen in the past by Dr. Debara Pickett. Last office visit was 07/16/2014. He was referred to Dr. Debara Pickett by his PCP, Dr. Crist Infante, given abnormal coronary calcium, incidentally found on a CT scan that was ordered by [facility] for evaluation of microscopic hematuria. This was a CT scan of the abdomen and pelvis which did not demonstrate any significant abnormalities in the kidneys, however there was coronary atherosclerosis including calcification at the right coronary artery which was incidentally noted. His additional past medical history also includes hyperlipidemia, treated with statin therapy and a  family history of coronary disease. EKG when referred to Dr. Debara Pickett also showed septal Q waves. Subsequently Dr. Debara Pickett ordered  an echocardiogram as well as an exercise tolerance test. Echocardiogram  demonstrated normal systolic and diastolic function. There was mild mitral regurgitation and mild left atrial enlargement. There was borderline aortic root dilatation at 3.9 cm. Per report, he did very well with his exercise stress test., exercising for 15 minutes and 17 METS. There were no EKG changes noted. He also had no chest pain during his stress test. Given lack of anginal symptomatology and no EKG changes concerning for ischemia during his Bruce Protocol treadmill stress test, no further cardiac evaluation was recommended at that time. It has also been outlined in Dr. Lysbeth Penner office notes that he has had a history of sinus bradycardia but had been asymptomatic in the past. He is not on any AV nodal blocking agents.  The patient reports that he was in his usual state of health earlier this morning. He is routinely physically active and exercises at the gym. Earlier this morning he was exercising on the elliptical which is a common routine for him. He die not eat breakfast prior to exercising this morning. He reports that he typically exercises for a duration of 30 minutes on the elliptical and is able to achieve a target heart rate in the 130s. However, earlier today he noted difficulties getting his heart rate above 110 bpm. This was after exercising for least 20 minutes on the elliptical. He noticed that it was very unusual that he couldn't get his heart rate above a certain level. About 20 minutes into his exercise routine, he started to feel "funny". Felt sluggish/fatigue but denied  any chest pain pressure or tightness. He also did not feel short of breath. Suddenly without warning he passed out. He woke up, his head was bleeding and he was surrounded by EMS. He denied any chest pain or shortness when he awoke. Since that time he has felt "fuzzy headed". He has since had some issues with short-term memory. He denies any unilateral  lateral extremity weakness, numbness or tingling.  EKG in the ED shows sinus bradycardia with a HR of 45 bpm.  He reports that this is a lot lower than his baseline. His baseline heart rate is typically in the upper 50s to low 60s. BG in the ED is normal at 103. K is WNL at 4.6. TSH pending. Hear rate remains in the 40s on telemetry. He also hit his head and sustained a head laceration. Head CT is pending.   Home Medications  Prior to Admission medications   Medication Sig Start Date End Date Taking? Authorizing Provider  cholecalciferol (VITAMIN D) 1000 UNITS tablet Take 2,000 Units by mouth daily.    Historical Provider, MD  ezetimibe (ZETIA) 10 MG tablet Take 10 mg by mouth daily. Takes one half tablet daily    Historical Provider, MD  fluticasone (FLONASE) 50 MCG/ACT nasal spray Place 2 sprays into both nostrils daily.    Historical Provider, MD  Omega-3 Fatty Acids (FISH OIL) 1200 MG CAPS Take 1 capsule by mouth daily.    Historical Provider, MD  simvastatin (ZOCOR) 40 MG tablet Take 40 mg by mouth daily.    Historical Provider, MD    Family History  Family History  Problem Relation Age of Onset  . Cancer Mother     skin   . Cancer Father     skin   . Heart Problems Maternal Grandmother   . Colon cancer Neg Hx     Social History  Social History   Social History  . Marital Status: Married    Spouse Name: N/A  . Number of Children: 2  . Years of Education: 16   Occupational History  . 2    Social History Main Topics  . Smoking status: Former Smoker    Quit date: 07/19/1988  . Smokeless tobacco: Never Used  . Alcohol Use: 8.4 oz/week    14 Cans of beer per week  . Drug Use: No  . Sexual Activity: Not on file   Other Topics Concern  . Not on file   Social History Narrative     Review of Systems General:  No chills, fever, night sweats or weight changes.  Cardiovascular:  No chest pain, dyspnea on exertion, edema, orthopnea, palpitations, paroxysmal nocturnal  dyspnea. Dermatological: No rash, lesions/masses Respiratory: No cough, dyspnea Urologic: No hematuria, dysuria Abdominal:   No nausea, vomiting, diarrhea, bright red blood per rectum, melena, or hematemesis Neurologic:  No visual changes, wkns, + changes in mental status and syncope All other systems reviewed and are otherwise negative except as noted above.  Physical Exam  Blood pressure 128/85, pulse 43, temperature 97.7 F (36.5 C), temperature source Oral, resp. rate 17, SpO2 100 %.  General: Pleasant, NAD, fit appearing Psych: Normal affect. Neuro: Alert and oriented X 3. Moves all extremities spontaneously. Issues with short term memory HEENT: left posterior scalp laceration   Neck: Supple without bruits or JVD. Lungs:  Resp regular and unlabored, CTA. Heart: Regular rhythm, brady rate no s3, s4, or murmurs. Abdomen: Soft, non-tender, non-distended, BS + x 4.  Extremities: No clubbing, cyanosis  or edema. DP/PT/Radials 2+ and equal bilaterally.  Labs  Troponin [facility] of Care Test)  Recent Labs  09/25/15 0824  TROPIPOC 0.01   No results for input(s): CKTOTAL, CKMB, TROPONINI in the last 72 hours. Lab Results  Component Value Date   WBC 5.0 09/25/2015   HGB 12.3* 09/25/2015   HCT 36.6* 09/25/2015   MCV 89.5 09/25/2015   PLT 153 09/25/2015    Recent Labs Lab 09/25/15 0815  NA 137  K 4.6  CL 110  CO2 23  BUN 24*  CREATININE 1.16  CALCIUM 8.7*  PROT 5.5*  BILITOT 0.8  ALKPHOS 60  ALT 30  AST 29  GLUCOSE 103*   No results found for: CHOL, HDL, LDLCALC, TRIG No results found for: DDIMER   Radiology/Studies  Dg Chest Portable 1 View  09/25/2015  CLINICAL DATA:  Patient with syncopal episode.  Low heart rate. EXAM: PORTABLE CHEST 1 VIEW COMPARISON:  None. FINDINGS: Multiple monitoring leads overlie the patient. Normal cardiac and mediastinal contours. No consolidative pulmonary opacities. No pleural effusion or pneumothorax. Regional skeleton is  unremarkable. IMPRESSION: No acute cardiopulmonary process. Electronically Signed   By: Lovey Newcomer M.D.   On: 09/25/2015 09:07    ECG  Sinus Bradycardia- 45 bpm     ASSESSMENT AND PLAN  1. Syncope: In the setting of bradycardia with resting heart rate in the 40s, which is a lot lower from the patient's baseline which is usually in the upper 50s - low 60s. The patient also notes difficulties getting his heart rate into the 130s while exercising earlier today (only able to reach 110 bpm). Physical  exam is negative for carotid bruits. BG level is stable at 103. He will need admission for further workup. Will need to rule out neurologic as well as cardiac etiologies. Recommend head CT to rule out intracranial abnormalities. Check TSH to rule out hypothyroidism. Check magnesium level. Check 2-D echocardiogram. Cycle cardiac enzymes 3.  Monitor on telemetry. No AV nodal blocking agents. Will have him undergo exercise testing.  2. Bradycardia: Assess for reversible causes. Check TSH and Mg. K is WNL. He is not on any AV nodal blocking agents. Also consider chronotropic incompetence, given patient's inability to get his heart rate above 110 bpm during exercise (usually able to reach 130 bpm with same activity).   3. Fall Subsequent to Syncope:  Patient suffered a left posterior scalp laceration. Hemostasis achieved with suturing by ED physician. He has had difficulties with short-term memory since his incident. He will need a head CT to r/o intracranial abnormalities. This would need to be performed to r/o intercranial bleeding.  4. HLD: on simvastatin as an outpatient. Given concerns for CAD, recommend fasting lipid panel to assess LDL, to ensure this is adequately controlled.     Signed, Lyda Jester, PA-C 09/25/2015, 9:37 AM  EP Attending  Patient seen and examined. I have made some changes in the note above by Lyda Jester, PA-C. The patient is well known to me. He was evaluated by  Dr. Debara Pickett when he was found to have coronary Calcium as an incidental finding on CT scanning. He exercises vigorously and has had no anginal symptoms. Today he was on the eliptical trainer and noted that his HR was only 110/min. He tried to increase his HR by exercising faster. He did not eat breakfast. He passed out. He was reportedly not orthostatic in the ED. He does not have a family history of CAD. His exam reveals a regular  bradycardia with no murmurs. Lungs are clear. Abdomen with no rebound or guarding. Extremities with no edema. Neuro is non-focal. ECG - sinus bradycardia. A/P 1. Syncope - the etiology is almost certainly vagal after pushing too hard on the treadmill. His relative bradycardia could be pathologic (chronotropic incompetence) but I suspect not. Will watch him on tele and plan an exercise test tomorrow. 2. CAD - while his has coronary calcification on CT scanning, the patient walked 15 minutes on a Bruce protocol 2 years ago. I have recommended he repeat his stress test. If he has evidence of coronary ischemia, then we would consider cardiac cath. 3. Sinus bradycardia - the patient does not appear to be symptomatic. Will observe on tele tonight. Will see how well he increases his HR with exertion. I do not think he needs a PPM at this time.  Mikle Bosworth.D.

## 2015-09-25 NOTE — H&P (Signed)
History and Physical    Connor Chung [id number] DOB: 03/17/1956 DOA: 09/25/2015   PCP: Jerlyn Ly, MD   Patient coming from/Resides with: Private residence/lives with wife  Chief Complaint: Witnessed syncope while exercising  HPI: Connor Chung is a 60 y.o. 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 Patient was utilizing the elliptical machine at the gym and typically attempts to get his heart rate up into the 130s. He had noticed for the past several weeks he had not been able to get his heart rate higher than the 110s. Today while attempting to get his heart rate up he began to feel somewhat weak and the next thing he remembers was waking up on the floor the gym. He has not started any new medications recently, he drinks plenty of water according to his wife. He's had some increasing right shoulder pain which is chronic and over the past 2 nights he has taken diclofenac without food but does not have any abdominal pain or dark stools. He has been empirically treated in the past for Lyme disease with prophylactic antibiotics and recently has been exposed to ticks but has not had any fevers, chills or meningeal symptoms. On Monday he drove by himself to [place], [place] and back on the same day. He did stop several times to get out and walk. He currently is neurologically intact. No reported bowel/bladder incontinence or seizure type activity reported from witnesses at the gym.  ED Course:  PO 97.7-BP 117/88-pulse 44-respirations 17-room air saturations 100% PCXR: No acute cardiopulmonary process Lab data: Sodium 137, potassium 4.6, CO2 23, BUN 24, creatinine 1.16, glucose 103, anion gap 4, total protein 5.5, LFTs normal, troponin 0.01, WBC 5000 with normal differential, hemoglobin 12.3 with normal MCV 89.5 Occipital scalp laceration approximated with staples by  EDP   Review of Systems:  In addition to the HPI above,  No Fever-chills, myalgias or other constitutional symptoms No Headache, changes with Vision or hearing, new weakness, tingling, numbness in any extremity, No problems swallowing food or Liquids, indigestion/reflux No Chest pain, Cough or Shortness of Breath, palpitations, orthopnea or DOE, no swelling in either lower extremity No Abdominal pain, N/V; no melena or hematochezia, no dark tarry stools, Bowel movements are regular, No dysuria, hematuria or flank pain No new skin rashes, lesions, masses or bruises, No new joints pains-aches No recent weight gain or loss No polyuria, polydypsia or polyphagia,   Past Medical History  Diagnosis Date  . Hyperlipidemia     Past Surgical History  Procedure Laterality Date  . Wisdom tooth extraction    . Transthoracic echocardiogram  08/2011    EF=>55%, borderline LA enlargement, RA mildly dilated, mild MR, trace TR, borderline aortic root dilatation   . 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 Cyst removal neck  2006    right clavicale     reports that he quit smoking about 27 years ago. He has never used smokeless tobacco. He reports that he drinks about 8.4 oz of alcohol per week. He reports that he does not use illicit drugs.  Mobility: Without assistive devices Work history: Not obtained   No Known Allergies  Family History  Problem Relation Age of Onset  . Cancer Mother     skin   . Cancer Father     skin   . Heart Problems Maternal Grandmother   .  Colon cancer Neg Hx      Prior to Admission medications   Medication Sig Start Date End Date Taking? Authorizing Provider  cholecalciferol (VITAMIN D) 1000 UNITS tablet Take 2,000 Units by mouth daily.    Historical Provider, MD  ezetimibe (ZETIA) 10 MG tablet Take 10 mg by mouth daily. Takes one half tablet daily    Historical Provider, MD  fluticasone (FLONASE) 50 MCG/ACT nasal spray Place 2 sprays into  both nostrils daily.    Historical Provider, MD  Omega-3 Fatty Acids (FISH OIL) 1200 MG CAPS Take 1 capsule by mouth daily.    Historical Provider, MD  simvastatin (ZOCOR) 40 MG tablet Take 40 mg by mouth daily.    Historical Provider, MD    Physical Exam: Filed Vitals:   09/25/15 0815 09/25/15 0830 09/25/15 0833 09/25/15 0915  BP: 117/88   128/85  Pulse: 51 45 43 43  Temp:      TempSrc:      Resp: 16 9 10 17   SpO2: 100% 100% 100% 100%      Constitutional: NAD, calm, comfortable with patient reporting feels a little "fuzzy headed" Eyes: PERRL, lids and conjunctivae normal ENMT: Mucous membranes are moist. Posterior pharynx clear of any exudate or lesions.Normal dentition.  Neck: normal, supple, no masses, no thyromegaly Respiratory: clear to auscultation bilaterally, no wheezing, no crackles. Normal respiratory effort. No accessory muscle use.  Cardiovascular: Regular rate and rhythm, no murmurs / rubs / gallops. No extremity edema. 2+ pedal pulses. No carotid bruits.  Abdomen: no tenderness, no masses palpated. No hepatosplenomegaly. Bowel sounds positive.  Musculoskeletal: no clubbing / cyanosis. No joint deformity upper and lower extremities. Good ROM, no contractures. Normal muscle tone.  Skin: no rashes, lesions, ulcers. No induration Neurologic: CN 2-12 grossly intact. Sensation intact, DTR normal. Strength 5/5 x all 4 extremities.  Psychiatric: Normal judgment and insight. Alert and oriented x 3. Normal mood.    Labs on Admission: I have personally reviewed following labs and imaging studies  CBC:  Recent Labs Lab 09/25/15 0815  WBC 5.0  NEUTROABS 3.1  HGB 12.3*  HCT 36.6*  MCV 89.5  PLT 0000000   Basic Metabolic Panel:  Recent Labs Lab 09/25/15 0815  NA 137  K 4.6  CL 110  CO2 23  GLUCOSE 103*  BUN 24*  CREATININE 1.16  CALCIUM 8.7*   GFR: CrCl cannot be calculated (Unknown ideal weight.). Liver Function Tests:  Recent Labs Lab 09/25/15 0815  AST  29  ALT 30  ALKPHOS 60  BILITOT 0.8  PROT 5.5*  ALBUMIN 3.5   No results for input(s): LIPASE, AMYLASE in the last 168 hours. No results for input(s): AMMONIA in the last 168 hours. Coagulation Profile: No results for input(s): INR, PROTIME in the last 168 hours. Cardiac Enzymes: No results for input(s): CKTOTAL, CKMB, CKMBINDEX, TROPONINI in the last 168 hours. BNP (last 3 results) No results for input(s): PROBNP in the last 8760 hours. HbA1C: No results for input(s): HGBA1C in the last 72 hours. CBG: No results for input(s): GLUCAP in the last 168 hours. Lipid Profile: No results for input(s): CHOL, HDL, LDLCALC, TRIG, CHOLHDL, LDLDIRECT in the last 72 hours. Thyroid Function Tests: No results for input(s): TSH, T4TOTAL, FREET4, T3FREE, THYROIDAB in the last 72 hours. Anemia Panel: No results for input(s): VITAMINB12, FOLATE, FERRITIN, TIBC, IRON, RETICCTPCT in the last 72 hours. Urine analysis: No results found for: COLORURINE, APPEARANCEUR, LABSPEC, PHURINE, GLUCOSEU, HGBUR, BILIRUBINUR, KETONESUR, PROTEINUR, UROBILINOGEN, NITRITE, LEUKOCYTESUR Sepsis  Labs: @LABRCNTIP (procalcitonin:4,lacticidven:4) )No results found for this or any previous visit (from the past 240 hour(s)).   Radiological Exams on Admission: Dg Chest Portable 1 View  09/25/2015  CLINICAL DATA:  Patient with syncopal episode.  Low heart rate. EXAM: PORTABLE CHEST 1 VIEW COMPARISON:  None. FINDINGS: Multiple monitoring leads overlie the patient. Normal cardiac and mediastinal contours. No consolidative pulmonary opacities. No pleural effusion or pneumothorax. Regional skeleton is unremarkable. IMPRESSION: No acute cardiopulmonary process. Electronically Signed   By: Lovey Newcomer M.D.   On: 09/25/2015 09:07    EKG: (Independently reviewed) Sinus bradycardia with ventricular rate 45 bpm, QTC 405 ms, peaked T waves V3-5 and absence of hypokalemia, no ischemic changes, no evidence of heart  block  Assessment/Plan Principal Problem:   Syncope -Patient had witnessed syncope without obvious seizure activity or postictal phase; the syncope episode was preceded by weakness but patient denies chest pain or shortness of breath patient was found to have concomitant sinus bradycardia -Orthostatic vital signs -Currently neurologically intact -Echocardiogram; last completed 2013 and at time he had normal ejection fraction, no LVH/HOCM physiology and no valvular heart disease/aortic stenosis -Telemetry monitoring -Patient does not have typical signs and symptoms of pulmonary embolism but given syncope and recent long distance travel in a motor vehicle for completeness of exam will check d-dimer  Active Problems:   Sinus bradycardia -Patient has chronic sinus bradycardia with heart rates in the 50s at baseline-today has had bradycardia with heart rates in the 40s and I observed transient decrease in heart rate 38 during my exam -Uncertain if patient had bradycardic event precipitating syncopal episode-he reports utilizing the machine handgrip mechanism to check his heart rate and it was in the mid 100s prior to feeling weak and experiencing his episode -Formal cardiology consultation pending; patient saw cardiology in 2016 for preoperative clearance and at that time he had a normal stress test although apparent additional imaging revealed calcification in the RCA    Normocytic anemia -Check TSH and anemia panel    Occipital scalp laceration -Approximated with staples by EDP -Routine wound care -Anticipate can be removed in 7-10 days    Dyslipidemia -Continue preadmission omega-3 fatty acids, Zocor and Zetia      DVT prophylaxis: Lovenox  Code Status: Full code  Family Communication: Wife, [place] at bedside Disposition Plan: Anticipate discharge to preadmission home environment once medically stable Consults called: Cardiology/ [facility] physician on call Admission status:  Observation/telemetry    Jaemarie Hochberg L. ANP-BC [facility] Pager [phone]   If 7PM-7AM, please contact night-coverage [email or web address] Password [email or web address]  09/25/2015, 9:52 AM

## 2015-09-25 NOTE — Progress Notes (Signed)
Patient refused bed alarm. Will continue to monitor patient. 

## 2015-09-25 NOTE — ED Notes (Addendum)
Patient was on elliptical at gymand passed out; laceration to back of head. Brady 44-50. States he does have normal low heart rate. [id number] systolic upon EMS arrival. Was diaphoretic upon arrival but EMS not sure if due to exercise. EMS states clammy. Did not eat breakfast in AM but sugar was 100's per EMS. EMS gave 250 bolus. Denies chest pain or SOB. Recent stress stress test due to having surgical procedure and he was too brady for anesthsia and sent to cardiologist, Dr.Hilty. Patient admits to being "fuzzy" but has gotten better with time and lying down.

## 2015-09-26 ENCOUNTER — Observation Stay (HOSPITAL_COMMUNITY): Payer: Managed Care, Other (non HMO)

## 2015-09-26 ENCOUNTER — Observation Stay (HOSPITAL_BASED_OUTPATIENT_CLINIC_OR_DEPARTMENT_OTHER): Payer: Managed Care, Other (non HMO)

## 2015-09-26 DIAGNOSIS — R55 Syncope and collapse: Secondary | ICD-10-CM | POA: Diagnosis not present

## 2015-09-26 DIAGNOSIS — T148 Other injury of unspecified body region: Secondary | ICD-10-CM

## 2015-09-26 DIAGNOSIS — T148XXA Other injury of unspecified body region, initial encounter: Secondary | ICD-10-CM | POA: Insufficient documentation

## 2015-09-26 DIAGNOSIS — R001 Bradycardia, unspecified: Secondary | ICD-10-CM | POA: Diagnosis not present

## 2015-09-26 DIAGNOSIS — E785 Hyperlipidemia, unspecified: Secondary | ICD-10-CM | POA: Diagnosis not present

## 2015-09-26 LAB — BASIC METABOLIC PANEL
Anion gap: 5 (ref 5–15)
BUN: 20 mg/dL (ref 6–20)
CHLORIDE: 105 mmol/L (ref 101–111)
CO2: 27 mmol/L (ref 22–32)
CREATININE: 1.02 mg/dL (ref 0.61–1.24)
Calcium: 9 mg/dL (ref 8.9–10.3)
GFR calc Af Amer: >60 mL/min (ref >60)
GFR calc non Af Amer: >60 mL/min (ref >60)
Glucose, Bld: 114 mg/dL — ABNORMAL HIGH (ref 65–99)
POTASSIUM: 4 mmol/L (ref 3.5–5.1)
SODIUM: 137 mmol/L (ref 135–145)

## 2015-09-26 LAB — CBC
HEMATOCRIT: 35 % — AB (ref 39.0–52.0)
Hemoglobin: 11.9 g/dL — ABNORMAL LOW (ref 13.0–17.0)
MCH: 29.9 pg (ref 26.0–34.0)
MCHC: 34 g/dL (ref 30.0–36.0)
MCV: 87.9 fL (ref 78.0–100.0)
PLATELETS: 171 10*3/uL (ref 150–400)
RBC: 3.98 MIL/uL — AB (ref 4.22–5.81)
RDW: 12.6 % (ref 11.5–15.5)
WBC: 4.7 10*3/uL (ref 4.0–10.5)

## 2015-09-26 LAB — ECHOCARDIOGRAM COMPLETE
Height: 73 in
WEIGHTICAEL: 2779.2 [oz_av]

## 2015-09-26 LAB — LIPID PANEL
CHOL/HDL RATIO: 2.3 ratio
Cholesterol: 138 mg/dL (ref 0–200)
HDL: 60 mg/dL (ref >40)
LDL CALC: 68 mg/dL (ref 0–99)
Triglycerides: 52 mg/dL (ref <150)
VLDL: 10 mg/dL (ref 0–40)

## 2015-09-26 LAB — EXERCISE TOLERANCE TEST
CSEPEW: 15.3 METS
CSEPPHR: 150 {beats}/min
Exercise duration (min): 13 min
MPHR: 162 {beats}/min
Percent HR: 92 %
Rest HR: 59 {beats}/min

## 2015-09-26 NOTE — Progress Notes (Signed)
Patient Profile: 60 year old male with history of HLD, family h/o CAD, sinus bradycardia and incidental findings of RCA coronary calcifications on CT scan in 2015, however low risk exercise tolerance test and normal 2-D echocardiogram in 2016, presenting to the [facility] ED for evaluation for syncope and bradycardia.   Subjective: Feels fine today. No further syncope/ near syncope. He tolerated ETT well. Exercised for 13 min, reaching maximum HR of 150 bpm w/o symptoms.   Objective: Vital signs in last 24 hours: Temp:  [97.9 F (36.6 C)-98.3 F (36.8 C)] 98.1 F (36.7 C) (06/02 0557) Pulse Rate:  [44-56] 52 (06/02 0557) Resp:  [11-20] 20 (06/02 0557) BP: (108-133)/(66-91) 109/66 mmHg (06/02 0557) SpO2:  [96 %-100 %] 97 % (06/02 0557) Weight:  [173 lb 3.2 oz (78.563 kg)-173 lb 11.2 oz (78.79 kg)] 173 lb 11.2 oz (78.79 kg) (06/02 0557) Last BM Date: 09/24/15  Intake/Output from previous day: 06/01 0701 - 06/02 0700 In: 594 [P.O.:594] Out: 1825 [Urine:1825] Intake/Output this shift: Total I/O In: 0  Out: 925 [Urine:925]  Medications Current Facility-Administered Medications  Medication Dose Route Frequency Provider Last Rate Last Dose  . acetaminophen (TYLENOL) tablet 650 mg  650 mg Oral Q6H PRN Samella Parr, NP       Or  . acetaminophen (TYLENOL) suppository 650 mg  650 mg Rectal Q6H PRN Samella Parr, NP      . cholecalciferol (VITAMIN D) tablet 2,000 Units  2,000 Units Oral Daily Samella Parr, NP      . enoxaparin (LOVENOX) injection 40 mg  40 mg Subcutaneous Q24H Samella Parr, NP   40 mg at 09/25/15 1729  . ezetimibe (ZETIA) tablet 5 mg  5 mg Oral Daily Samella Parr, NP   5 mg at 09/25/15 2045  . fluticasone (FLONASE) 50 MCG/ACT nasal spray 2 spray  2 spray Each Nare Daily Samella Parr, NP   2 spray at 09/25/15 1729  . omega-3 acid ethyl esters (LOVAZA) capsule 1 g  1 g Oral BID Samella Parr, NP   1 g at 09/25/15 2204  . simvastatin (ZOCOR) tablet 40  mg  40 mg Oral Daily Samella Parr, NP   40 mg at 09/25/15 1726  . sodium chloride flush (NS) 0.9 % injection 3 mL  3 mL Intravenous Q12H Samella Parr, NP   3 mL at 09/25/15 2206    PE: General appearance: alert, cooperative, no distress and fit appearing Neck: no carotid bruit and no JVD Lungs: clear to auscultation bilaterally Heart: regular rate and rhythm, S1, S2 normal, no murmur, click, rub or gallop Extremities: no LEE Pulses: 2+ and symmetric Skin: warm and dry Neurologic: Grossly normal  Lab Results:   Recent Labs  09/25/15 0815 09/26/15 0221  WBC 5.0 4.7  HGB 12.3* 11.9*  HCT 36.6* 35.0*  PLT 153 171   BMET  Recent Labs  09/25/15 0815 09/26/15 0221  NA 137 137  K 4.6 4.0  CL 110 105  CO2 23 27  GLUCOSE 103* 114*  BUN 24* 20  CREATININE 1.16 1.02  CALCIUM 8.7* 9.0   Cardiac Panel (last 3 results) No results for input(s): CKTOTAL, CKMB, TROPONINI, RELINDX in the last 72 hours.  Studies/Results: ETT 09/26/15- no ischemia. Low risk stress test. EKGs are in [facility].  2D Echo- pending  Assessment/Plan    Principal Problem:   Syncope Active Problems:   Dyslipidemia   Sinus bradycardia   Normocytic anemia  Occipital scalp laceration   1. Syncope: etiology is felt to be vagal after pushing too hard on the treadmill. He was able to exercise beyond his target HR of 137, reaching a max HR of 150 bpm, ruling out chronotropic incompetence. He performed ETT for 13 min, beyond target HR w/o EKG changes suggestive of ischemia and no chest pain. Dr. Sarina Ill to review ETT EKGs in [facility] and will further advise.   2. CAD: evidence of coronary calcifications on prior CT scan 2 years ago. No ischemic EKG changes nor symptoms with ETT today. No plans for LHC.   3. Sinus Bradycardia: Resting HR prior to ETT was 55 bpm. He is asymptomatic with this. No chronotropic incompetence. Patient was able to get HR well above target.   Brittainy M. Ladoris Gene 09/26/2015 10:30 AM  Cardiology Attending  Patient seen and examined. Agree with above. He has passed his stress test and is low risk despite his episode of syncope. Will plan to dc home. I will see him back in the office.  Mikle Bosworth.D.

## 2015-09-26 NOTE — Discharge Summary (Signed)
Physician Discharge Summary  Connor Chung MRN: [id number] DOB/AGE: 1956/02/22 60 y.o.  PCP: Jerlyn Ly, MD   Admit date: 09/25/2015 Discharge date: 09/26/2015  Discharge Diagnoses:     Principal Problem:   Syncope Active Problems:   Dyslipidemia   Sinus bradycardia   Normocytic anemia   Occipital scalp laceration   Contusion    Follow-up recommendations Follow-up with PCP in 3-5 days , including all  additional recommended appointments as below Follow-up CBC, CMP in 3-5 days Patient is follow-up with cardiology in the outpatient setting, cardiology will notify patient about the appointment as per Dr. Harrington Challenger      Current Discharge Medication List    CONTINUE these medications which have NOT CHANGED   Details  cholecalciferol (VITAMIN D) 1000 UNITS tablet Take 2,000 Units by mouth daily.    diclofenac (VOLTAREN) 75 MG EC tablet Take 75 mg by mouth daily as needed for mild pain.    ezetimibe (ZETIA) 10 MG tablet Take 5 mg by mouth daily. Takes one half tablet daily    fluticasone (FLONASE) 50 MCG/ACT nasal spray Place 2 sprays into both nostrils daily.    Omega-3 Fatty Acids (FISH OIL) 1200 MG CAPS Take 1 capsule by mouth daily.    simvastatin (ZOCOR) 40 MG tablet Take 40 mg by mouth daily.         Discharge Condition: Stable   Discharge Instructions Get Medicines reviewed and adjusted: Please take all your medications with you for your next visit with your Primary MD  Please request your Primary MD to go over all hospital tests and procedure/radiological results at the follow up, please ask your Primary MD to get all Hospital records sent to his/her office.  If you experience worsening of your admission symptoms, develop shortness of breath, life threatening emergency, suicidal or homicidal thoughts you must seek medical attention immediately by calling 911 or calling your MD immediately if symptoms less severe.  You must read complete instructions/literature  along with all the possible adverse reactions/side effects for all the Medicines you take and that have been prescribed to you. Take any new Medicines after you have completely understood and accpet all the possible adverse reactions/side effects.   Do not drive when taking Pain medications.   Do not take more than prescribed Pain, Sleep and Anxiety Medications  Special Instructions: If you have smoked or chewed Tobacco in the last 2 yrs please stop smoking, stop any regular Alcohol and or any Recreational drug use.  Wear Seat belts while driving.  Please note  You were cared for by a hospitalist during your hospital stay. Once you are discharged, your primary care physician will handle any further medical issues. Please note that NO REFILLS for any discharge medications will be authorized once you are discharged, as it is imperative that you return to your primary care physician (or establish a relationship with a primary care physician if you do not have one) for your aftercare needs so that they can reassess your need for medications and monitor your lab values.  Discharge Instructions    Diet - low sodium heart healthy    Complete by:  As directed      Increase activity slowly    Complete by:  As directed             No Known Allergies    Disposition: Home   Consults: Cardiology     Significant Diagnostic Studies:  Ct Head Wo Contrast  09/25/2015  CLINICAL DATA:  Syncope with fall EXAM: CT HEAD WITHOUT CONTRAST TECHNIQUE: Contiguous axial images were obtained from the base of the skull through the vertex without intravenous contrast. COMPARISON:  None. FINDINGS: The ventricles are normal in size and configuration. There is no intracranial mass, hemorrhage, extra-axial fluid collection, or midline shift. Gray-white compartments appear normal. No acute infarct evident. A small amount of basal ganglia calcification is felt to be physiologic. There is a left parietal scalp  hematoma with surgical clips in this area. The bony calvarium appears intact. Mastoid air cells are clear. No intraorbital lesions are evident. IMPRESSION: Left parietal scalp hematoma. No intracranial mass, hemorrhage, or extra-axial fluid collection. Gray-white compartments appear within normal limits. Electronically Signed   By: Lowella Grip III M.D.   On: 09/25/2015 11:06   Dg Chest Portable 1 View  09/25/2015  CLINICAL DATA:  Patient with syncopal episode.  Low heart rate. EXAM: PORTABLE CHEST 1 VIEW COMPARISON:  None. FINDINGS: Multiple monitoring leads overlie the patient. Normal cardiac and mediastinal contours. No consolidative pulmonary opacities. No pleural effusion or pneumothorax. Regional skeleton is unremarkable. IMPRESSION: No acute cardiopulmonary process. Electronically Signed   By: Lovey Newcomer M.D.   On: 09/25/2015 09:07   2-D echo-completed results pending    Filed Weights   09/25/15 1155 09/26/15 0557  Weight: 78.563 kg (173 lb 3.2 oz) 78.79 kg (173 lb 11.2 oz)     Microbiology: No results found for this or any previous visit (from the past 240 hour(s)).     Blood Culture No results found for: SDES, SPECREQUEST, CULT, REPTSTATUS    Labs: Results for orders placed or performed during the hospital encounter of 09/25/15 (from the past 48 hour(s))  Comprehensive metabolic panel     Status: Abnormal   Collection Time: 09/25/15  8:15 AM  Result Value Ref Range   Sodium 137 135 - 145 mmol/L   Potassium 4.6 3.5 - 5.1 mmol/L   Chloride 110 101 - 111 mmol/L   CO2 23 22 - 32 mmol/L   Glucose, Bld 103 (H) 65 - 99 mg/dL   BUN 24 (H) 6 - 20 mg/dL   Creatinine, Ser 1.16 0.61 - 1.24 mg/dL   Calcium 8.7 (L) 8.9 - 10.3 mg/dL   Total Protein 5.5 (L) 6.5 - 8.1 g/dL   Albumin 3.5 3.5 - 5.0 g/dL   AST 29 15 - 41 U/L   ALT 30 17 - 63 U/L   Alkaline Phosphatase 60 38 - 126 U/L   Total Bilirubin 0.8 0.3 - 1.2 mg/dL   GFR calc non Af Amer >60 >60 mL/min   GFR calc Af Amer  >60 >60 mL/min    Comment: (NOTE) The eGFR has been calculated using the CKD EPI equation. This calculation has not been validated in all clinical situations. eGFR's persistently <60 mL/min signify possible Chronic Kidney Disease.    Anion gap 4 (L) 5 - 15  CBC with Differential     Status: Abnormal   Collection Time: 09/25/15  8:15 AM  Result Value Ref Range   WBC 5.0 4.0 - 10.5 K/uL   RBC 4.09 (L) 4.22 - 5.81 MIL/uL   Hemoglobin 12.3 (L) 13.0 - 17.0 g/dL   HCT 36.6 (L) 39.0 - 52.0 %   MCV 89.5 78.0 - 100.0 fL   MCH 30.1 26.0 - 34.0 pg   MCHC 33.6 30.0 - 36.0 g/dL   RDW 12.6 11.5 - 15.5 %   Platelets 153 150 - 400 K/uL   Neutrophils  Relative % 61 %   Neutro Abs 3.1 1.7 - 7.7 K/uL   Lymphocytes Relative 28 %   Lymphs Abs 1.4 0.7 - 4.0 K/uL   Monocytes Relative 9 %   Monocytes Absolute 0.4 0.1 - 1.0 K/uL   Eosinophils Relative 2 %   Eosinophils Absolute 0.1 0.0 - 0.7 K/uL   Basophils Relative 0 %   Basophils Absolute 0.0 0.0 - 0.1 K/uL  I-Stat Troponin, ED (not at [facility])     Status: None   Collection Time: 09/25/15  8:24 AM  Result Value Ref Range   Troponin i, poc 0.01 0.00 - 0.08 ng/mL   Comment 3            Comment: Due to the release kinetics of cTnI, a negative result within the first hours of the onset of symptoms does not rule out myocardial infarction with certainty. If myocardial infarction is still suspected, repeat the test at appropriate intervals.   D-dimer, quantitative (not at [facility])     Status: None   Collection Time: 09/25/15 10:02 AM  Result Value Ref Range   D-Dimer, Quant <0.27 0.00 - 0.50 ug/mL-FEU    Comment: (NOTE) At the manufacturer cut-off of 0.50 ug/mL FEU, this assay has been documented to exclude PE with a sensitivity and negative predictive value of 97 to 99%.  At this time, this assay has not been approved by the FDA to exclude DVT/VTE. Results should be correlated with clinical presentation.   TSH     Status: None   Collection Time:  09/25/15 10:02 AM  Result Value Ref Range   TSH 3.206 0.350 - 4.500 uIU/mL  Vitamin B12     Status: None   Collection Time: 09/25/15 10:02 AM  Result Value Ref Range   Vitamin B-12 271 180 - 914 pg/mL    Comment: (NOTE) This assay is not validated for testing neonatal or myeloproliferative syndrome specimens for Vitamin B12 levels.   Folate     Status: None   Collection Time: 09/25/15 10:02 AM  Result Value Ref Range   Folate 13.1 >5.9 ng/mL  Iron and TIBC     Status: None   Collection Time: 09/25/15 10:02 AM  Result Value Ref Range   Iron 100 45 - 182 ug/dL   TIBC 329 250 - 450 ug/dL   Saturation Ratios 30 17.9 - 39.5 %   UIBC 229 ug/dL  Ferritin     Status: None   Collection Time: 09/25/15 10:02 AM  Result Value Ref Range   Ferritin 190 24 - 336 ng/mL  Reticulocytes     Status: None   Collection Time: 09/25/15 10:02 AM  Result Value Ref Range   Retic Ct Pct 1.0 0.4 - 3.1 %   RBC. 4.39 4.22 - 5.81 MIL/uL   Retic Count, Manual 43.9 19.0 - 186.0 K/uL  Basic metabolic panel     Status: Abnormal   Collection Time: 09/26/15  2:21 AM  Result Value Ref Range   Sodium 137 135 - 145 mmol/L   Potassium 4.0 3.5 - 5.1 mmol/L   Chloride 105 101 - 111 mmol/L   CO2 27 22 - 32 mmol/L   Glucose, Bld 114 (H) 65 - 99 mg/dL   BUN 20 6 - 20 mg/dL   Creatinine, Ser 1.02 0.61 - 1.24 mg/dL   Calcium 9.0 8.9 - 10.3 mg/dL   GFR calc non Af Amer >60 >60 mL/min   GFR calc Af Amer >60 >60 mL/min  Comment: (NOTE) The eGFR has been calculated using the CKD EPI equation. This calculation has not been validated in all clinical situations. eGFR's persistently <60 mL/min signify possible Chronic Kidney Disease.    Anion gap 5 5 - 15  CBC     Status: Abnormal   Collection Time: 09/26/15  2:21 AM  Result Value Ref Range   WBC 4.7 4.0 - 10.5 K/uL   RBC 3.98 (L) 4.22 - 5.81 MIL/uL   Hemoglobin 11.9 (L) 13.0 - 17.0 g/dL   HCT 35.0 (L) 39.0 - 52.0 %   MCV 87.9 78.0 - 100.0 fL   MCH 29.9 26.0 -  34.0 pg   MCHC 34.0 30.0 - 36.0 g/dL   RDW 12.6 11.5 - 15.5 %   Platelets 171 150 - 400 K/uL     Lipid Panel  No results found for: CHOL, TRIG, HDL, CHOLHDL, VLDL, LDLCALC, LDLDIRECT   No results found for: HGBA1C   Lab Results  Component Value Date   CREATININE 1.02 09/26/2015     HPI : 60 year old male with history of HLD, family h/o CAD, sinus bradycardia and incidental findings of RCA coronary calcifications on CT scan in 2015, however low risk exercise tolerance test and normal 2-D echocardiogram in 2016, presenting to the [facility] ED for evaluation for syncope in the setting of bradycardia.   HOSPITAL COURSE:  syncope: Resting heart rate 40s, orthostatics negative, does not take any rate controlling medications Patient scheduled for exercise stress test to rule out chronotropic incompetence.He was able to exercise beyond his target HR of 137.Pt had excellent exercise tolerance, going into Stage V  HR responded appropriately EKG showed no ST changes to suggest ischemia Physical exam is negative for carotid bruits. BG level is stable at 103.  head CT showed left parietal scalp hematoma no intracranial mass.  D-dimer negative TSH normal Check magnesium level. 2-D echo  Completed results pending  . Cycle cardiac enzymes 3. Telemetry shows sinus pericardia.  Received a call from Dr. Harrington Challenger, okay to discharge patient today, and cardiology will be in touch with the patient  Coronary artery disease-enzymes negative Stress test today , Results as above     Sinus bradycardia TSH normal, check magnesium He is not on any AV nodal blocking agents. Also consider chronotropic incompetence,    Scalp laceration. Hemostasis achieved with suturing by ED physician. He has had difficulties with short-term memory since his incident.  head CTdid not show any intracranial hemorrhage, just left parietal scalp hematoma.    Dyslipidemia on simvastatin as an outpatient. Check  lipid panel   Normocytic anemia stable around 12 TSH normal and anemia panel normal       Blood pressure 109/66, pulse 52, temperature 98.1 F (36.7 C), temperature source Oral, resp. rate 20, height _0  (1.854 m), weight 78.79 kg (173 lb 11.2 oz), SpO2 97 %.   General: Pleasant, NAD, fit appearing Psych: Normal affect. Neuro: Alert and oriented X 3. Moves all extremities spontaneously. Issues with short term memory HEENT: left posterior scalp laceration  Neck: Supple without bruits or JVD. Lungs: Resp regular and unlabored, CTA. Heart: Regular rhythm, brady rate no s3, s4, or murmurs. Abdomen: Soft, non-tender, non-distended, BS + x 4.  Extremities: No clubbing, cyanosis or edema. DP/PT/Radials 2+ and equal bilaterally.   Follow-up Information    Follow up with PERINI,MARK A, MD. Schedule an appointment as soon as possible for a visit in 3 days.   Specialty:  Internal Medicine  Why:  Hospital follow-up   Contact information:   [address] [place] [address] [phone]       Signed: Reyne Dumas 09/26/2015, 11:02 AM        Time spent >45 mins

## 2015-09-26 NOTE — Progress Notes (Signed)
  Echocardiogram 2D Echocardiogram has been performed.  Connor Chung 09/26/2015, 11:15 AM

## 2015-09-26 NOTE — Progress Notes (Signed)
Reviewed GXT  Pt had excellent exercise tolerance, going into Stage V   HR responded appropriately EKG showed no ST changes to suggest ischemia  OK to d/c from a cardiac standpoint WIll be in touch with pt re follow up recommendations    Dorris Carnes

## 2015-09-26 NOTE — Progress Notes (Addendum)
[facility] PROGRESS NOTE  Connor Chung [id number] DOB: March 22, 1956 DOA: 09/25/2015   PCP: Jerlyn Ly, MD     Assessment/Plan: Principal Problem:   Syncope Active Problems:   Dyslipidemia   Sinus bradycardia   Normocytic anemia   Occipital scalp laceration   Contusion  60 year old male with history of HLD, family h/o CAD, sinus bradycardia and incidental findings of RCA coronary calcifications on CT scan in 2015, however low risk exercise tolerance test and normal 2-D echocardiogram in 2016, presenting to the [facility] ED for evaluation for syncope in the setting of bradycardia.  Assessment and plan   syncope: Resting heart rate 40s, orthostatics negative, does not take any rate controlling medications Patient scheduled for exercise stress test to rule out chronotropic incompetence.He was able to exercise beyond his target HR of 137  Physical exam is negative for carotid bruits. BG level is stable at 103.  head CT  showed left parietal scalp hematoma no intracranial mass.  D-dimer negative TSH normal Check magnesium level. Check 2-D echocardiogram. Cycle cardiac enzymes 3. Telemetry shows sinus pericardia.   Coronary artery disease-enzymes negative Stress test today Patient may need a cardiac cath if stress test is abnormal  Sinus bradycardia TSH normal, check magnesium  He is not on any AV nodal blocking agents. Also consider chronotropic incompetence,     Scalp laceration. Hemostasis achieved with suturing by ED physician. He has had difficulties with short-term memory since his incident. He will need a head CT to r/o intracranial abnormalities. This would need to be performed to r/o intercranial bleeding.  Dyslipidemia on simvastatin as an outpatient. Check lipid panel   Normocytic anemia stable around 12 TSH normal and anemia panel normal   DVT prophylaxsis Lovenox  Code Status:  Full code     Family Communication: Discussed in detail with  the patient, all imaging results, lab results explained to the patient   Disposition Plan:  Per cardiology    Consultants:  Cardiology  Procedures:  None  Antibiotics: Anti-infectives    None         HPI/Subjective: No recurrent episodes of syncope  Objective: Filed Vitals:   09/25/15 1155 09/25/15 2154 09/26/15 0025 09/26/15 0557  BP: 115/76 108/69 114/71 109/66  Pulse: 48 56 50 52  Temp: 97.9 F (36.6 C) 97.9 F (36.6 C) 98.3 F (36.8 C) 98.1 F (36.7 C)  TempSrc: Oral Oral Oral Oral  Resp: 16 20 20 20   Height: 6\' 1"  (1.854 m)     Weight: 78.563 kg (173 lb 3.2 oz)   78.79 kg (173 lb 11.2 oz)  SpO2: 100% 96% 97% 97%    Intake/Output Summary (Last 24 hours) at 09/26/15 1035 Last data filed at 09/26/15 [id number]  Gross per 24 hour  Intake    594 ml  Output   2750 ml  Net  -2156 ml    Exam:  Examination:  General exam: Appears calm and comfortable  Respiratory system: Clear to auscultation. Respiratory effort normal. Cardiovascular system: S1 & S2 heard, RRR. No JVD, murmurs, rubs, gallops or clicks. No pedal edema. Gastrointestinal system: Abdomen is nondistended, soft and nontender. No organomegaly or masses felt. Normal bowel sounds heard. Central nervous system: Alert and oriented. No focal neurological deficits. Extremities: Symmetric 5 x 5 power. Skin: No rashes, lesions or ulcers Psychiatry: Judgement and insight appear normal. Mood & affect appropriate.     Data Reviewed: I have personally reviewed following labs and imaging studies  Micro Results No results found for this or any previous visit (from the past 240 hour(s)).  Radiology Reports Ct Head Wo Contrast  09/25/2015  CLINICAL DATA:  Syncope with fall EXAM: CT HEAD WITHOUT CONTRAST TECHNIQUE: Contiguous axial images were obtained from the base of the skull through the vertex without intravenous contrast. COMPARISON:  None. FINDINGS: The ventricles are normal in size and configuration.  There is no intracranial mass, hemorrhage, extra-axial fluid collection, or midline shift. Gray-white compartments appear normal. No acute infarct evident. A small amount of basal ganglia calcification is felt to be physiologic. There is a left parietal scalp hematoma with surgical clips in this area. The bony calvarium appears intact. Mastoid air cells are clear. No intraorbital lesions are evident. IMPRESSION: Left parietal scalp hematoma. No intracranial mass, hemorrhage, or extra-axial fluid collection. Gray-white compartments appear within normal limits. Electronically Signed   By: Lowella Grip III M.D.   On: 09/25/2015 11:06   Dg Chest Portable 1 View  09/25/2015  CLINICAL DATA:  Patient with syncopal episode.  Low heart rate. EXAM: PORTABLE CHEST 1 VIEW COMPARISON:  None. FINDINGS: Multiple monitoring leads overlie the patient. Normal cardiac and mediastinal contours. No consolidative pulmonary opacities. No pleural effusion or pneumothorax. Regional skeleton is unremarkable. IMPRESSION: No acute cardiopulmonary process. Electronically Signed   By: Lovey Newcomer M.D.   On: 09/25/2015 09:07     CBC  Recent Labs Lab 09/25/15 0815 09/26/15 0221  WBC 5.0 4.7  HGB 12.3* 11.9*  HCT 36.6* 35.0*  PLT 153 171  MCV 89.5 87.9  MCH 30.1 29.9  MCHC 33.6 34.0  RDW 12.6 12.6  LYMPHSABS 1.4  --   MONOABS 0.4  --   EOSABS 0.1  --   BASOSABS 0.0  --     Chemistries   Recent Labs Lab 09/25/15 0815 09/26/15 0221  NA 137 137  K 4.6 4.0  CL 110 105  CO2 23 27  GLUCOSE 103* 114*  BUN 24* 20  CREATININE 1.16 1.02  CALCIUM 8.7* 9.0  AST 29  --   ALT 30  --   ALKPHOS 60  --   BILITOT 0.8  --    ------------------------------------------------------------------------------------------------------------------ estimated creatinine clearance is 88 mL/min (by C-G formula based on Cr of  1.02). ------------------------------------------------------------------------------------------------------------------ No results for input(s): HGBA1C in the last 72 hours. ------------------------------------------------------------------------------------------------------------------ No results for input(s): CHOL, HDL, LDLCALC, TRIG, CHOLHDL, LDLDIRECT in the last 72 hours. ------------------------------------------------------------------------------------------------------------------  Recent Labs  09/25/15 1002  TSH 3.206   ------------------------------------------------------------------------------------------------------------------  Recent Labs  09/25/15 1002  VITAMINB12 271  FOLATE 13.1  FERRITIN 190  TIBC 329  IRON 100  RETICCTPCT 1.0    Coagulation profile No results for input(s): INR, PROTIME in the last 168 hours.   Recent Labs  09/25/15 1002  DDIMER <0.27    Cardiac Enzymes No results for input(s): CKMB, TROPONINI, MYOGLOBIN in the last 168 hours.  Invalid input(s): CK ------------------------------------------------------------------------------------------------------------------ Invalid input(s): POCBNP   CBG: No results for input(s): GLUCAP in the last 168 hours.     Studies: Ct Head Wo Contrast  09/25/2015  CLINICAL DATA:  Syncope with fall EXAM: CT HEAD WITHOUT CONTRAST TECHNIQUE: Contiguous axial images were obtained from the base of the skull through the vertex without intravenous contrast. COMPARISON:  None. FINDINGS: The ventricles are normal in size and configuration. There is no intracranial mass, hemorrhage, extra-axial fluid collection, or midline shift. Gray-white compartments appear normal. No acute infarct evident. A small amount of basal ganglia calcification is  felt to be physiologic. There is a left parietal scalp hematoma with surgical clips in this area. The bony calvarium appears intact. Mastoid air cells are clear. No  intraorbital lesions are evident. IMPRESSION: Left parietal scalp hematoma. No intracranial mass, hemorrhage, or extra-axial fluid collection. Gray-white compartments appear within normal limits. Electronically Signed   By: Lowella Grip III M.D.   On: 09/25/2015 11:06   Dg Chest Portable 1 View  09/25/2015  CLINICAL DATA:  Patient with syncopal episode.  Low heart rate. EXAM: PORTABLE CHEST 1 VIEW COMPARISON:  None. FINDINGS: Multiple monitoring leads overlie the patient. Normal cardiac and mediastinal contours. No consolidative pulmonary opacities. No pleural effusion or pneumothorax. Regional skeleton is unremarkable. IMPRESSION: No acute cardiopulmonary process. Electronically Signed   By: Lovey Newcomer M.D.   On: 09/25/2015 09:07      No results found for: HGBA1C Lab Results  Component Value Date   CREATININE 1.02 09/26/2015       Scheduled Meds: . cholecalciferol  2,000 Units Oral Daily  . enoxaparin (LOVENOX) injection  40 mg Subcutaneous Q24H  . ezetimibe  5 mg Oral Daily  . fluticasone  2 spray Each Nare Daily  . omega-3 acid ethyl esters  1 g Oral BID  . simvastatin  40 mg Oral Daily  . sodium chloride flush  3 mL Intravenous Q12H   Continuous Infusions:       Time spent: >30 MINS    [facility] Pager [phone]. If 7PM-7AM, please contact night-coverage at [email or web address], password [facility] 09/26/2015, 10:35 AM

## 2015-09-26 NOTE — Progress Notes (Signed)
Orders received for pt discharge.  Discharge summary printed and reviewed with pt.  Explained medication regimen, and pt had no further questions at this time.  IV removed and site remains clean, dry, intact.  Telemetry removed.  Pt in stable condition and awaiting transport. 

## 2015-10-15 ENCOUNTER — Telehealth: Payer: Self-pay | Admitting: Gastroenterology

## 2015-10-16 NOTE — Telephone Encounter (Signed)
Spoke with the patient. No pain. No drainage. He has been aware of it for a couple of months. Appointment made for evaluation.

## 2015-11-19 ENCOUNTER — Ambulatory Visit (INDEPENDENT_AMBULATORY_CARE_PROVIDER_SITE_OTHER): Payer: Managed Care, Other (non HMO) | Admitting: Gastroenterology

## 2015-11-19 ENCOUNTER — Encounter: Payer: Self-pay | Admitting: Gastroenterology

## 2015-11-19 VITALS — BP 126/68 | HR 78 | Ht 73.0 in | Wt 176.0 lb

## 2015-11-19 DIAGNOSIS — K625 Hemorrhage of anus and rectum: Secondary | ICD-10-CM | POA: Diagnosis not present

## 2015-11-19 DIAGNOSIS — K641 Second degree hemorrhoids: Secondary | ICD-10-CM | POA: Diagnosis not present

## 2015-11-19 NOTE — Patient Instructions (Signed)
Use Preparation H as needed Use Benefiber 1 tablespoon three times a day with meals as needed

## 2015-11-19 NOTE — Progress Notes (Signed)
Connor Chung    [id number]    05/02/1955  Primary Care Physician:PERINI,MARK A, MD  Referring Physician: Crist Infante, MD [address] [place], [place] [address]  Chief complaint: Rectal nodule , intermittent bright red blood per rectum   HPI: 60 year old male here with complaints of intermittent bright red blood per rectum and is here for evaluation of possible rectal nodule. He noticed a nodule that was somewhat soft in his rectum 2-3 months ago, and has since resolved. He also had a few episodes of bright red blood per rectum is very small volume 2-3 times around the same time. He hasn't had any issues in the past 2 months. He underwent colonoscopy in November 2016 which was unremarkable except for diverticulosis and internal hemorrhoids and a benign polyp removed was actually polypoid mucosa. Patient feels well otherwise and has no other complaints. Denies any nausea, vomiting, abdominal pain, melena or bright red blood per rectum    Outpatient Encounter Prescriptions as of 11/19/2015  Medication Sig  . cholecalciferol (VITAMIN D) 1000 UNITS tablet Take 2,000 Units by mouth daily.  . diclofenac (VOLTAREN) 75 MG EC tablet Take 75 mg by mouth daily as needed for mild pain.  Marland Kitchen ezetimibe (ZETIA) 10 MG tablet Take 5 mg by mouth daily. Takes one half tablet daily  . fluticasone (FLONASE) 50 MCG/ACT nasal spray Place 2 sprays into both nostrils daily.  . Multiple Vitamin (MULTIVITAMIN) tablet Take 1 tablet by mouth daily.  . Omega-3 Fatty Acids (FISH OIL) 1200 MG CAPS Take 1 capsule by mouth daily.  . simvastatin (ZOCOR) 40 MG tablet Take 40 mg by mouth daily.  . vitamin B-12 (CYANOCOBALAMIN) 100 MCG tablet Take 100 mcg by mouth daily.   No facility-administered encounter medications on file as of 11/19/2015.     Allergies as of 11/19/2015  . (No Known Allergies)    Past Medical History:  Diagnosis Date  . Hyperlipidemia     Past Surgical History:  Procedure  Laterality Date  . 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 CYST REMOVAL NECK  2006   right clavicale  . TRANSTHORACIC ECHOCARDIOGRAM  08/2011   EF=>55%, borderline LA enlargement, RA mildly dilated, mild MR, trace TR, borderline aortic root dilatation   . WISDOM TOOTH EXTRACTION      Family History  Problem Relation Age of Onset  . Cancer Mother     skin   . Cancer Father     skin   . Heart Problems Maternal Grandmother   . Colon cancer Neg Hx     Social History   Social History  . Marital status: Married    Spouse name: N/A  . Number of children: 2  . Years of education: 16   Occupational History  . 2    Social History Main Topics  . Smoking status: Former Smoker    Quit date: 07/19/1988  . Smokeless tobacco: Never Used  . Alcohol use 8.4 oz/week    14 Cans of beer per week  . Drug use: No  . Sexual activity: Not on file   Other Topics Concern  . Not on file   Social History Narrative  . No narrative on file      Review of systems: Review of Systems  Constitutional: Negative for fever and chills.  HENT: Negative.   Eyes: Negative for blurred vision.  Respiratory: Negative for cough, shortness of breath and wheezing.  Cardiovascular: Negative for chest pain and palpitations.  Gastrointestinal: as per HPI Genitourinary: Negative for dysuria, urgency, frequency and hematuria.  Musculoskeletal: Negative for myalgias, back pain and joint pain.  Skin: Negative for itching and rash.  Neurological: Negative for dizziness, tremors, focal weakness, seizures and loss of consciousness.  Endo/Heme/Allergies: Negative for environmental allergies.  Psychiatric/Behavioral: Negative for depression, suicidal ideas and hallucinations.  All other systems reviewed and are negative.   Physical Exam: Vitals:   11/19/15 0913  BP: 126/68  Pulse: 78   Gen:      No acute distress HEENT:  EOMI, sclera anicteric Neck:     No masses; no thyromegaly Lungs:     Clear to auscultation bilaterally; normal respiratory effort CV:         Regular rate and rhythm; no murmurs Abd:      + bowel sounds; soft, non-tender; no palpable masses, no distension Ext:    No edema; adequate peripheral perfusion Skin:      Warm and dry; no rash Neuro: alert and oriented x 3 Psych: normal mood and affect Rectal exam: Normal anal sphincter tone, no anal fissure or external hemorrhoids Anoscopy: medium internal hemorrhoids Grade II in right anterior and right posterior, no active bleeding, normal dentate line, no visible nodules  Data Reviewed: Reviewed data in chart   Assessment and Plan/Recommendations: 33 yr M with c/o intermittent BRBPR and sensation of ?rectal nodule likely prolasped internal hemorrhoids that has resolved since is here for evaluation No visible nodules on anoscopy or on palpation on rectal exam. Medium size Grade II internal hemorrhoids, patient is currently not symptomatic Will call to schedule hemorrhoidal band ligation if continues to have symptoms Benefiber 1 table TID with meals Preparation H per rectum as needed  25 minutes was spent face-to-face with the patient. Greater than 50% of the time used for counseling  as well as treatment plan and follow-up. He had multiple questions which were answered to his satisfaction    K. Denzil Magnuson , MD [phone] Mon-Fri 8a-5p [phone] after 5p, weekends, holidays  CC: Crist Infante, MD

## 2016-04-05 ENCOUNTER — Encounter (INDEPENDENT_AMBULATORY_CARE_PROVIDER_SITE_OTHER): Payer: Self-pay

## 2016-04-05 ENCOUNTER — Ambulatory Visit (INDEPENDENT_AMBULATORY_CARE_PROVIDER_SITE_OTHER): Payer: Managed Care, Other (non HMO)

## 2016-04-05 ENCOUNTER — Ambulatory Visit (INDEPENDENT_AMBULATORY_CARE_PROVIDER_SITE_OTHER): Payer: Managed Care, Other (non HMO) | Admitting: Orthopaedic Surgery

## 2016-04-05 DIAGNOSIS — M25512 Pain in left shoulder: Secondary | ICD-10-CM | POA: Diagnosis not present

## 2016-04-05 MED ORDER — LIDOCAINE HCL 1 % IJ SOLN
3.0000 mL | INTRAMUSCULAR | Status: AC | PRN
  Administered 2016-04-05: 3 mL

## 2016-04-05 MED ORDER — METHYLPREDNISOLONE ACETATE 40 MG/ML IJ SUSP
40.0000 mg | INTRAMUSCULAR | Status: AC | PRN
  Administered 2016-04-05: 40 mg via INTRA_ARTICULAR

## 2016-04-05 NOTE — Progress Notes (Signed)
Office Visit Note   Patient: Connor Chung           Date of Birth: December 26, 1955           MRN: [id number] Visit Date: 04/05/2016              Requested by: Crist Infante, MD [address] [place], [place] [address] PCP: Jerlyn Ly, MD   Assessment & Plan: Visit Diagnoses:  1. Acute pain of left shoulder     Plan: He tolerated the injection in his left shoulder well. I would have him refrain from pushups for the next month. If he has any issues with the shoulder after then he'll let us know but I expect he will do well  Follow-Up Instructions: Return if symptoms worsen or fail to improve.   Orders:  Orders Placed This Encounter  Procedures  . Large Joint Injection/Arthrocentesis  . XR Shoulder Left   No orders of the defined types were placed in this encounter.     Procedures: Large Joint Inj Date/Time: 04/05/2016 2:21 PM Performed by: Mcarthur Rossetti Authorized by: Mcarthur Rossetti   Location:  Shoulder Site:  L subacromial bursa Ultrasound Guidance: No   Fluoroscopic Guidance: No   Arthrogram: No   Medications:  3 mL lidocaine 1 %; 40 mg methylPREDNISolone acetate 40 MG/ML     Clinical Data: No additional findings.   Subjective: Chief Complaint  Patient presents with  . Left Shoulder - Pain    Patient having shoulder pain for about 8 months. No know injury. Not constant pain. Pain with certain ROM and strength   He complains of pain with overhead activities and reaching behind him with left shoulder. He is not sure that he really injure the shoulders been hurting mainly with activities. It does wake him up some at night. He denies any neck pain. He denies any numbness and tingling in his left hand. HPI  Review of Systems Negative for headache, shortness breath, chest pain, fever, chills, nausea, vomiting.  Objective: Vital Signs: There were no vitals taken for this visit.  Physical Exam He is alert and oriented 3 in no acute  distress Ortho Exam Emanation of his left shoulder does show positive signs of impingement but otherwise a well located shoulder. He has 5 out of 5 strength of the rotator cuff in all planes. He does have pain with internal rotation with adduction and can reach to the mid lumbar spine on the left side. Specialty Comments:  No specialty comments available.  Imaging: Xr Shoulder Left  Result Date: 04/05/2016 3 views of his left shoulder AP, outlet, axillary views show well located shoulder. There is no acute irregularity or fracture. The shoulders well located. There is slight decrease in the subacromial outlet.    PMFS History: Patient Active Problem List   Diagnosis Date Noted  . Contusion   . Syncope 09/25/2015  . Normocytic anemia 09/25/2015  . Occipital scalp laceration 09/25/2015  . Sinus bradycardia 07/16/2014  . Dyslipidemia 08/01/2013  . Elevated coronary artery calcium score 08/01/2013   Past Medical History:  Diagnosis Date  . Hyperlipidemia     Family History  Problem Relation Age of Onset  . Cancer Mother     skin   . Cancer Father     skin   . Heart Problems Maternal Grandmother   . Colon cancer Neg Hx     Past Surgical History:  Procedure Laterality Date  . cyst removal face  10/2014  cyst adenocarcinoma  . CYST REMOVAL NECK  2006   right clavicale  . TRANSTHORACIC ECHOCARDIOGRAM  08/2011   EF=>55%, borderline LA enlargement, RA mildly dilated, mild MR, trace TR, borderline aortic root dilatation   . WISDOM TOOTH EXTRACTION     Social History   Occupational History  . 2    Social History Main Topics  . Smoking status: Former Smoker    Quit date: 07/19/1988  . Smokeless tobacco: Never Used  . Alcohol use 8.4 oz/week    14 Cans of beer per week  . Drug use: No  . Sexual activity: Not on file

## 2016-10-22 ENCOUNTER — Telehealth: Payer: Self-pay | Admitting: Gastroenterology

## 2016-10-22 NOTE — Telephone Encounter (Signed)
Discussed with the patient. He will begin Kegel exercises for men. Appointment scheduled.

## 2016-10-22 NOTE — Telephone Encounter (Signed)
Leakage could be secondary to prolapsed hemorrhoids , decreased sphincter tone. Advise patient Kegel exercises and follow up for evaluation and possible hemorrhoidal band ligation. Thanks

## 2016-10-22 NOTE — Telephone Encounter (Signed)
Patient calls because he has noticed a spotting of a brownish "liquid" on his underwear. He does not notice anything abnormal when he cleans. He recently had a little constipation which resolved naturally. No OTC's taken. No pain with bowel movements. No rectal itching or irritation.

## 2016-10-22 NOTE — Telephone Encounter (Signed)
Per patient request I have left this information on his voicemail. Advised him to call soon to schedule the follow up for evaluation and possible hemorrhoidal banding.

## 2016-12-08 ENCOUNTER — Telehealth: Payer: Self-pay | Admitting: Internal Medicine

## 2016-12-08 NOTE — Telephone Encounter (Signed)
Received records from [facility] for appointment on 12/24/16 with Dr Debara Pickett.  Records put with Dr Lysbeth Penner schedule for 12/24/16. lp

## 2016-12-13 ENCOUNTER — Encounter (INDEPENDENT_AMBULATORY_CARE_PROVIDER_SITE_OTHER): Payer: Self-pay

## 2016-12-13 ENCOUNTER — Ambulatory Visit: Payer: Managed Care, Other (non HMO) | Admitting: Internal Medicine

## 2016-12-13 ENCOUNTER — Ambulatory Visit (INDEPENDENT_AMBULATORY_CARE_PROVIDER_SITE_OTHER): Payer: Managed Care, Other (non HMO) | Admitting: Gastroenterology

## 2016-12-13 ENCOUNTER — Encounter: Payer: Self-pay | Admitting: Gastroenterology

## 2016-12-13 VITALS — BP 82/60 | HR 57 | Ht 73.0 in | Wt 177.0 lb

## 2016-12-13 DIAGNOSIS — K641 Second degree hemorrhoids: Secondary | ICD-10-CM | POA: Diagnosis not present

## 2016-12-13 DIAGNOSIS — K625 Hemorrhage of anus and rectum: Secondary | ICD-10-CM | POA: Diagnosis not present

## 2016-12-13 DIAGNOSIS — R151 Fecal smearing: Secondary | ICD-10-CM | POA: Diagnosis not present

## 2016-12-13 NOTE — Patient Instructions (Addendum)
HEMORRHOID BANDING PROCEDURE    FOLLOW-UP CARE   1. The procedure you have had should have been relatively painless since the banding of the area involved does not have nerve endings and there is no pain sensation.  The rubber band cuts off the blood supply to the hemorrhoid and the band may fall off as soon as 48 hours after the banding (the band may occasionally be seen in the toilet bowl following a bowel movement). You may notice a temporary feeling of fullness in the rectum which should respond adequately to plain Tylenol or Motrin.  2. Following the banding, avoid strenuous exercise that evening and resume full activity the next day.  A sitz bath (soaking in a warm tub) or bidet is soothing, and can be useful for cleansing the area after bowel movements.     3. To avoid constipation, take two tablespoons of natural wheat bran, natural oat bran, flax, Benefiber or any over the counter fiber supplement and increase your water intake to 7-8 glasses daily.    4. Unless you have been prescribed anorectal medication, do not put anything inside your rectum for two weeks: No suppositories, enemas, fingers, etc.  5. Occasionally, you may have more bleeding than usual after the banding procedure.  This is often from the untreated hemorrhoids rather than the treated one.  Don't be concerned if there is a tablespoon or so of blood.  If there is more blood than this, lie flat with your bottom higher than your head and apply an ice pack to the area. If the bleeding does not stop within a half an hour or if you feel faint, call our office at [phone] or go to the emergency room.  6. Problems are not common; however, if there is a substantial amount of bleeding, severe pain, chills, fever or difficulty passing urine (very rare) or other problems, you should call us at [phone] or report to the nearest emergency room.  7. Do not stay seated continuously for more than 2-3 hours for a day or two  after the procedure.  Tighten your buttock muscles 10-15 times every two hours and take 10-15 deep breaths every 1-2 hours.  Do not spend more than a few minutes on the toilet if you cannot empty your bowel; instead re-visit the toilet at a later time.    We have scheduled you for your 2nd banding on Sept. 17, 2018 at 2:00pm We have scheduled you for your 3rd banding on Oct. 16, 2018 at 3:15pm

## 2016-12-13 NOTE — Progress Notes (Signed)
Connor Chung    [id number]    May 20, 1955  Primary Care Physician:Perini, Elta Guadeloupe, MD  Referring Physician: Crist Infante, [place], [place] [address]  Chief complaint: Hemorrhoids  HPI:  8 yr M here with c/o intermittent BRBPR, seepage/soiling and swelling in the anal region. Patient was last seen in office July 2017 and was noted to have prolapsed internal hemorrhoid.  Past colonoscopy in November 2016 with evidence of mild sigmoid diverticulosis and internal hemorrhoids, otherwise unremarkable. Small sessile polyp removed was negative for adenomatous tissue.  Patient denies any nausea, vomiting, abdominal pain, constipation, diarrhea or melena. Denies excessive straining with bowel movements or pain with defecation.    Outpatient Encounter Prescriptions as of 12/13/2016  Medication Sig  . cholecalciferol (VITAMIN D) 1000 UNITS tablet Take 2,000 Units by mouth daily.  . diclofenac (VOLTAREN) 75 MG EC tablet Take 75 mg by mouth daily as needed for mild pain.  Marland Kitchen ezetimibe (ZETIA) 10 MG tablet Take 5 mg by mouth daily. Takes one half tablet daily  . fluticasone (FLONASE) 50 MCG/ACT nasal spray Place 2 sprays into both nostrils daily.  . Multiple Vitamin (MULTIVITAMIN) tablet Take 1 tablet by mouth daily.  . Omega-3 Fatty Acids (FISH OIL) 1200 MG CAPS Take 1 capsule by mouth daily.  . simvastatin (ZOCOR) 40 MG tablet Take 40 mg by mouth daily.  . vitamin B-12 (CYANOCOBALAMIN) 100 MCG tablet Take 100 mcg by mouth daily.   No facility-administered encounter medications on file as of 12/13/2016.     Allergies as of 12/13/2016  . (No Known Allergies)    Past Medical History:  Diagnosis Date  . Hyperlipidemia     Past Surgical History:  Procedure Laterality Date  . 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 CYST REMOVAL NECK  2006   right clavicale  . TRANSTHORACIC ECHOCARDIOGRAM  08/2011   EF=>55%, borderline LA enlargement, RA mildly dilated,  mild MR, trace TR, borderline aortic root dilatation   . WISDOM TOOTH EXTRACTION      Family History  Problem Relation Age of Onset  . Cancer Mother        skin   . Cancer Father        skin   . Heart Problems Maternal Grandmother   . Colon cancer Neg Hx     Social History   Social History  . Marital status: Married    Spouse name: N/A  . Number of children: 2  . Years of education: 16   Occupational History  . 2    Social History Main Topics  . Smoking status: Former Smoker    Quit date: 07/19/1988  . Smokeless tobacco: Never Used  . Alcohol use 8.4 oz/week    14 Cans of beer per week  . Drug use: No  . Sexual activity: Not on file   Other Topics Concern  . Not on file   Social History Narrative  . No narrative on file      Review of systems: Review of Systems  Constitutional: Negative for fever and chills.  HENT: Negative.   Eyes: Negative for blurred vision.  Respiratory: Negative for cough, shortness of breath and wheezing.   Cardiovascular: Negative for chest pain and palpitations.  Gastrointestinal: as per HPI Genitourinary: Negative for dysuria, urgency, frequency and hematuria.  Musculoskeletal: Negative for myalgias, back pain and joint pain.  Skin: Negative for itching and rash.  Neurological: Negative  for dizziness, tremors, focal weakness, seizures and loss of consciousness.  Endo/Heme/Allergies: Negative for seasonal allergies.  Psychiatric/Behavioral: Negative for depression, suicidal ideas and hallucinations.  All other systems reviewed and are negative.   Physical Exam: Vitals:   12/13/16 0842  BP: (!) 82/60  Pulse: (!) 57   Body mass index is 23.35 kg/m. Gen:      No acute distress HEENT:  EOMI, sclera anicteric Abd:      + bowel sounds; soft, non-tender; no palpable masses, no distension Ext:    No edema; adequate peripheral perfusion Skin:      Warm and dry; no rash Neuro: alert and oriented x 3 Psych: normal mood and  affect  Data Reviewed:  Reviewed labs, radiology imaging, old records and pertinent past GI work up   Assessment and Plan/Recommendations:  The patient presents with symptomatic grade II  hemorrhoids, requesting rubber band ligation of his/her hemorrhoidal disease.  All risks, benefits and alternative forms of therapy were described and informed consent was obtained.  In the Left Lateral Decubitus position anoscopic examination revealed grade II hemorrhoids in the Left lateral, right posterior and right anterior position(s).  The anorectum was pre-medicated with 0.125% nitroglycerin and Recticare The decision was made to band the Left lateral internal hemorrhoid, and the [place] was used to perform band ligation without complication.  Digital anorectal examination was then performed to assure proper positioning of the band, and to adjust the banded tissue as required.  The patient was discharged home without pain or other issues.  Dietary and behavioral recommendations were given and along with follow-up instructions.  left lateral    The patient will return in 2-4 weeks for  follow-up and possible additional banding as required. No complications were encountered and the patient tolerated the procedure well.    Damaris Hippo , MD [phone] Mon-Fri 8a-5p [phone] after 5p, weekends, holidays  CC: Crist Infante, MD

## 2016-12-15 ENCOUNTER — Other Ambulatory Visit: Payer: Self-pay | Admitting: Otolaryngology

## 2016-12-15 DIAGNOSIS — R220 Localized swelling, mass and lump, head: Secondary | ICD-10-CM

## 2016-12-17 ENCOUNTER — Ambulatory Visit
Admission: RE | Admit: 2016-12-17 | Discharge: 2016-12-17 | Disposition: A | Discharge status: Home / Self Care | Payer: Managed Care, Other (non HMO) | Source: Ambulatory Visit | Attending: Otolaryngology | Admitting: Otolaryngology

## 2016-12-17 DIAGNOSIS — R220 Localized swelling, mass and lump, head: Secondary | ICD-10-CM

## 2016-12-17 MED ORDER — IOPAMIDOL (ISOVUE-300) INJECTION 61%
75.0000 mL | Freq: Once | INTRAVENOUS | Status: AC | PRN
  Administered 2016-12-17: 75 mL via INTRAVENOUS

## 2016-12-23 ENCOUNTER — Ambulatory Visit: Payer: Managed Care, Other (non HMO) | Admitting: Internal Medicine

## 2016-12-24 ENCOUNTER — Ambulatory Visit (INDEPENDENT_AMBULATORY_CARE_PROVIDER_SITE_OTHER): Payer: Managed Care, Other (non HMO) | Admitting: Internal Medicine

## 2016-12-24 ENCOUNTER — Encounter: Payer: Self-pay | Admitting: Internal Medicine

## 2016-12-24 VITALS — BP 110/64 | HR 48 | Ht 73.0 in | Wt 175.4 lb

## 2016-12-24 DIAGNOSIS — R079 Chest pain, unspecified: Secondary | ICD-10-CM

## 2016-12-24 DIAGNOSIS — R001 Bradycardia, unspecified: Secondary | ICD-10-CM | POA: Diagnosis not present

## 2016-12-24 DIAGNOSIS — R931 Abnormal findings on diagnostic imaging of heart and coronary circulation: Secondary | ICD-10-CM

## 2016-12-24 NOTE — Patient Instructions (Signed)
Dr. Debara Pickett has ordered a coronary CT - needs to be done ASAP (will be unavailable end of September)  Your physician recommends that you schedule a follow-up appointment with Dr. Debara Pickett after your testing.

## 2016-12-24 NOTE — Progress Notes (Signed)
OFFICE NOTE  Chief Complaint:  Chest pressure  Primary Care Physician: Crist Infante, MD  HPI:  Connor Chung is a very pleasant 29 showed male kindly referred to me by Dr. Joylene Draft.  He has a history of dyslipidemia, a family history of coronary disease and dyslipidemia and recently has been having troubles with microscopic hematuria. He underwent a CT scan of the abdomen and pelvis on 06/12/2013 through [facility]. This did not demonstrate any significant abnormalities in the kidneys, however there was coronary atherosclerosis including calcification of the right coronary artery which was incidentally noted. Apparently Connor Chung has a history of septal Q waves seen on his ECG and underwent an echocardiogram which was performed in our office on 09/17/2011. This demonstrated normal systolic and diastolic function. There was mild mitral regurgitation and mild left atrial enlargement. There is borderline aortic root dilatation at 3.9 cm.  He is on Zocor 40 mg daily for his dyslipidemia which is well controlled. His LDL particle number is 965, HDL PD 38-LDL size 21 nmol, total cholesterol 173, triglycerides 71, HDL 55 and LDL 104..  he is active and asymptomatic, denying any chest pain or shortness of breath or limitations to activity.  Connor Chung returns today for followup of his exercise tolerance test. He did very well with the test exercising for 15 minutes and 17 METS. There were no EKG changes noted. He had no chest pain.  [name] was referred back to me today for evaluation of an abnormal EKG. He underwent preoperative testing and an EKG showed marked sinus bradycardia with a PAC. There was concern for possible septal infarct and incomplete right bundle branch block. I compared his EKG with his prior EKG from the office and there is essentially no change other than a heart rate slightly lower. It turns out that he's had some bradycardia in the past which is a general sinus bradycardia.  That being said, as above he exercised quite a lot on his exercise tolerance test without any symptoms. He continues to have no symptoms in fact he has better exercise tolerance now that he's been more regularly exercising that he had when I saw him previously.  12/24/2016  Connor Chung was kindly referred back for evaluation of recent chest pressure. I last saw him in 2016 it was noted to have right coronary artery calcification which was seen on abdominal imaging. He subsequently underwent treadmill exercise stress testing which was negative for ischemia. He has a history of bradycardia in the past but can get his heart rate elevated with exercise. Recently he decided to go on a trip to walk on the [place] trail. This is coming up in the end of September. It will involve some very strenuous hiking. He is been training for that and has become more short of breath and described a chest pressure when using a StairMaster and treadmill at very high inclines. He's also noted when walking up stairs, particularly on a recent trip to [place], he became short of breath and had chest tightness. He had not previously had this symptom in the past. Is noted to be bradycardic today with heart rate in the upper 40s and EKG shows PACs and an incomplete right bundle branch block. Blood pressure is well-controlled.  PMHx:  Past Medical History:  Diagnosis Date  . Allergic rhinitis   . Hyperlipidemia     Past Surgical History:  Procedure Laterality Date  . 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  CYST REMOVAL NECK  2006   right clavicale  . TRANSTHORACIC ECHOCARDIOGRAM  08/2011   EF=>55%, borderline LA enlargement, RA mildly dilated, mild MR, trace TR, borderline aortic root dilatation   . WISDOM TOOTH EXTRACTION      FAMHx:  Family History  Problem Relation Age of Onset  . Cancer Mother        skin   . Cancer Father        skin   . Heart Problems Maternal Grandmother   . Colon cancer Neg Hx      SOCHx:   reports that he quit smoking about 28 years ago. He has never used smokeless tobacco. He reports that he drinks about 8.4 oz of alcohol per week . He reports that he does not use drugs.  ALLERGIES:  No Known Allergies  ROS: A comprehensive review of systems was negative.  HOME MEDS: Current Outpatient Prescriptions  Medication Sig Dispense Refill  . cholecalciferol (VITAMIN D) 1000 UNITS tablet Take 2,000 Units by mouth daily.    Marland Kitchen ezetimibe (ZETIA) 10 MG tablet Take 5 mg by mouth daily. Takes one half tablet daily    . fluticasone (FLONASE) 50 MCG/ACT nasal spray Place 2 sprays into both nostrils daily.    . Omega-3 Fatty Acids (FISH OIL) 1200 MG CAPS Take 1 capsule by mouth daily.    . simvastatin (ZOCOR) 40 MG tablet Take 40 mg by mouth daily.    . vitamin B-12 (CYANOCOBALAMIN) 100 MCG tablet Take 100 mcg by mouth daily.     No current facility-administered medications for this visit.     LABS/IMAGING: No results found for this or any previous visit (from the past 48 hour(s)). No results found.  VITALS: BP 110/64   Pulse (!) 48   Ht 6\' 1"  (1.854 m)   Wt 175 lb 6.4 oz (79.6 kg)   BMI 23.14 kg/m   EXAM: General appearance: alert and no distress Lungs: clear to auscultation bilaterally Heart: regular rate and rhythm, S1, S2 normal, no murmur, click, rub or gallop Extremities: extremities normal, atraumatic, no cyanosis or edema  EKG: Sinus bradycardia with PVCs at 48, possible left atrial enlargement, incomplete right bundle branch block-personally reviewed  ASSESSMENT: 1. Chest pressure-possible unstable angina 2. Abnormal right coronary artery calcium - low risk Bruce treadmill stress test (2016) 3. Dyslipidemia-well controlled 4. 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 Asymptomatic sinus bradycardia  PLAN: 1.   Connor Chung is describing chest pressure and shortness of breath with exertion,  particularly walking up stairs. In review he's had some bradycardia in the past. 2018 heart rate was in the 70s, in 2018 in the 50s, and now in the upper 40s. He has right coronary artery calcium and this could be a manifestation of progressive RCA disease. This might explain some chest pressure that he is now getting with more marked exertion in training for his upcoming [employer]. I'm concerned about the physicality of this exercise and his symptoms and EKG findings. This may be angina.   I'm recommending a CT coronary angiogram which will give [place] better resolution of his coronary artery and if there is evidence for coronary stenosis could be further evaluated with CT FFR. Plan follow-up with me afterwards to discuss the findings.  Thanks as always for the kind referral.  Pixie Casino, MD, [facility] Attending Cardiologist [place] 12/24/2016, 6:24 PM

## 2016-12-30 ENCOUNTER — Telehealth: Payer: Self-pay | Admitting: Internal Medicine

## 2016-12-30 NOTE — Telephone Encounter (Signed)
Left message for pt to call.

## 2016-12-30 NOTE — Telephone Encounter (Signed)
New message   Pt would like more information about CT scan and what he needs to do for it, more about it etc from nurse. He states no one has contacted him with more information.

## 2016-12-31 ENCOUNTER — Other Ambulatory Visit: Payer: Self-pay | Admitting: *Deleted

## 2016-12-31 DIAGNOSIS — Z79899 Other long term (current) drug therapy: Secondary | ICD-10-CM

## 2016-12-31 DIAGNOSIS — Z01818 Encounter for other preprocedural examination: Secondary | ICD-10-CM

## 2016-12-31 NOTE — Telephone Encounter (Signed)
Patient aware  Per cert will be contacting insurance

## 2016-12-31 NOTE — Telephone Encounter (Signed)
spoke to patient he wanted to know if test has been schedule for CARDIAC CTA.  Patient is  schedule to go out of town on [place]- hiking trip He would like have test as soon as possible and states he may like to pay out of pocket if necessary.  RN informed patient still awaiting for per cert. And BMP IS NEEDED. Will INFORM SCHEDULER AND SOME ONE WILL CALL HIM WITH THE PROCESS OF TEST  PATIENT verbalized understanding and states he would like to complete process as soon as possible

## 2017-01-03 ENCOUNTER — Telehealth: Payer: Self-pay | Admitting: Internal Medicine

## 2017-01-03 DIAGNOSIS — R931 Abnormal findings on diagnostic imaging of heart and coronary circulation: Secondary | ICD-10-CM

## 2017-01-03 NOTE — Telephone Encounter (Signed)
New message    Pt is calling stating Dr. Debara Pickett wanted him to have a CT. He received a letter from his insurance company and would like to speak to nurse about it. Please call.

## 2017-01-03 NOTE — Telephone Encounter (Signed)
Returned call to patient he stated he would like to have cardiac ct scheduled soon.Stated he will be going out of town 01/20/17.He will have bmet done 01/04/17.Advised I will send message to precert pool and scheduling pool.

## 2017-01-04 LAB — BASIC METABOLIC PANEL
BUN / CREAT RATIO: 18 (ref 9–20)
BUN: 19 mg/dL (ref 6–24)
CALCIUM: 9.2 mg/dL (ref 8.7–10.2)
CO2: 21 mmol/L (ref 20–29)
Chloride: 102 mmol/L (ref 96–106)
Creatinine, Ser: 1.03 mg/dL (ref 0.76–1.27)
GFR, EST AFRICAN AMERICAN: 91 mL/min/{1.73_m2} (ref >59)
GFR, EST NON AFRICAN AMERICAN: 79 mL/min/{1.73_m2} (ref >59)
Glucose: 124 mg/dL — ABNORMAL HIGH (ref 65–99)
Potassium: 4.1 mmol/L (ref 3.5–5.2)
Sodium: 137 mmol/L (ref 134–144)

## 2017-01-05 ENCOUNTER — Encounter: Payer: Self-pay | Admitting: Internal Medicine

## 2017-01-05 NOTE — Telephone Encounter (Signed)
Patient scheduled for coronary CT 01/11/17

## 2017-01-05 NOTE — Telephone Encounter (Signed)
Spoke with patient regarding CT. He states he got a notice this AM that test was approved. Approval # [id number]. Sent message to Mack Guise to contact patient to schedule.

## 2017-01-10 ENCOUNTER — Ambulatory Visit (INDEPENDENT_AMBULATORY_CARE_PROVIDER_SITE_OTHER): Payer: Managed Care, Other (non HMO) | Admitting: Gastroenterology

## 2017-01-10 ENCOUNTER — Encounter: Payer: Self-pay | Admitting: Gastroenterology

## 2017-01-10 VITALS — BP 90/60 | HR 59 | Ht 73.0 in | Wt 178.0 lb

## 2017-01-10 DIAGNOSIS — K641 Second degree hemorrhoids: Secondary | ICD-10-CM | POA: Diagnosis not present

## 2017-01-10 NOTE — Progress Notes (Signed)
PROCEDURE NOTE: The patient presents with symptomatic grade II hemorrhoids, requesting rubber band ligation of his/her hemorrhoidal disease.  All risks, benefits and alternative forms of therapy were described and informed consent was obtained.   The anorectum was pre-medicated with 0.125% Nitroglycerine and Recticare The decision was made to band the Right posterior internal hemorrhoid, and the [facility] System was used to perform band ligation without complication.  Digital anorectal examination was then performed to assure proper positioning of the band, and to adjust the banded tissue as required.  The patient was discharged home without pain or other issues.  Dietary and behavioral recommendations were given and along with follow-up instructions.      The patient will return in 2-4 weeks for  follow-up and possible additional banding as required. No complications were encountered and the patient tolerated the procedure well.  K. Veena Nandigam , MD [phone] Mon-Fri 8a-5p [phone] after 5p, weekends, holidays  

## 2017-01-10 NOTE — Patient Instructions (Signed)

## 2017-01-11 ENCOUNTER — Ambulatory Visit (HOSPITAL_COMMUNITY): Admission: RE | Admit: 2017-01-11 | Payer: Managed Care, Other (non HMO) | Source: Ambulatory Visit

## 2017-01-11 ENCOUNTER — Ambulatory Visit (HOSPITAL_COMMUNITY)
Admission: RE | Admit: 2017-01-11 | Discharge: 2017-01-11 | Disposition: A | Discharge status: Home / Self Care | Payer: Managed Care, Other (non HMO) | Source: Ambulatory Visit | Attending: Internal Medicine | Admitting: Internal Medicine

## 2017-01-11 DIAGNOSIS — R001 Bradycardia, unspecified: Secondary | ICD-10-CM

## 2017-01-11 DIAGNOSIS — R079 Chest pain, unspecified: Secondary | ICD-10-CM

## 2017-01-11 DIAGNOSIS — R931 Abnormal findings on diagnostic imaging of heart and coronary circulation: Secondary | ICD-10-CM

## 2017-01-11 DIAGNOSIS — I2510 Atherosclerotic heart disease of native coronary artery without angina pectoris: Secondary | ICD-10-CM | POA: Insufficient documentation

## 2017-01-11 MED ORDER — SODIUM CHLORIDE 0.9 % IV BOLUS (SEPSIS)
250.0000 mL | Freq: Once | INTRAVENOUS | Status: AC
  Administered 2017-01-11: 250 mL via INTRAVENOUS

## 2017-01-11 MED ORDER — IOPAMIDOL (ISOVUE-370) INJECTION 76%
INTRAVENOUS | Status: AC
  Administered 2017-01-11: 100 mL
  Filled 2017-01-11: qty 100

## 2017-01-11 MED ORDER — NITROGLYCERIN 0.4 MG SL SUBL
SUBLINGUAL_TABLET | SUBLINGUAL | Status: AC
  Filled 2017-01-11: qty 2

## 2017-01-11 MED ORDER — NITROGLYCERIN 0.4 MG SL SUBL
0.8000 mg | SUBLINGUAL_TABLET | Freq: Once | SUBLINGUAL | Status: AC
  Administered 2017-01-11: 0.8 mg via SUBLINGUAL
  Filled 2017-01-11: qty 25

## 2017-01-14 ENCOUNTER — Telehealth: Payer: Self-pay | Admitting: Internal Medicine

## 2017-01-14 ENCOUNTER — Telehealth: Payer: Self-pay | Admitting: *Deleted

## 2017-01-14 NOTE — Telephone Encounter (Signed)
Based on the CT he should be fine with his trip. I can see him back afterwards if that is better, however, I do need to see him back so we can optimize his medical therapy.  Dr. Lemmie Evens

## 2017-01-14 NOTE — Telephone Encounter (Signed)
Pt advised, voiced understanding and thanks. He's aware to call back soon to f/u for scheduling OV.

## 2017-01-14 NOTE — Telephone Encounter (Signed)
New message   Pt calling and wanting CT results back. Requests a call back

## 2017-01-14 NOTE — Telephone Encounter (Signed)
Results and recommendations discussed with patient, who verbalized understanding and thanks. He is aware of recommendation to schedule follow up with Dr. Debara Pickett but stated he would call back to arrange.

## 2017-01-14 NOTE — Telephone Encounter (Signed)
I spoke to this patient earlier about his coronary CT results.  He notes understanding of findings - aware of recommendation for f/u.  Pt was trying to get in for return OV before he leaves on a 17-day hike on the [place] trail. He is supposed to leave Thursday to go on this hike.  Does he need to be seen before then/are we able to book him in? Or can he be cleared to go on this trip?

## 2017-01-24 ENCOUNTER — Encounter: Payer: Managed Care, Other (non HMO) | Admitting: Gastroenterology

## 2017-02-08 ENCOUNTER — Encounter: Payer: Self-pay | Admitting: Gastroenterology

## 2017-02-08 ENCOUNTER — Ambulatory Visit (INDEPENDENT_AMBULATORY_CARE_PROVIDER_SITE_OTHER): Payer: Managed Care, Other (non HMO) | Admitting: Gastroenterology

## 2017-02-08 VITALS — Ht 73.0 in | Wt 172.6 lb

## 2017-02-08 DIAGNOSIS — K641 Second degree hemorrhoids: Secondary | ICD-10-CM

## 2017-02-08 NOTE — Progress Notes (Signed)
PROCEDURE NOTE: The patient presents with symptomatic grade II hemorrhoids, requesting rubber band ligation of his/her hemorrhoidal disease.  All risks, benefits and alternative forms of therapy were described and informed consent was obtained.   The anorectum was pre-medicated with 0.125% Nitroglycerine and Recticare The decision was made to band the Right anterior internal hemorrhoid, and the [place] was used to perform band ligation without complication.  Digital anorectal examination was then performed to assure proper positioning of the band, and to adjust the banded tissue as required.  The patient was discharged home without pain or other issues.  Dietary and behavioral recommendations were given and along with follow-up instructions.     The following adjunctive treatments were recommended:  Benefiber 1 tablespoon BID  The patient will return for  follow-up and possible additional banding as required. No complications were encountered and the patient tolerated the procedure well.  Damaris Hippo , MD [phone] Mon-Fri 8a-5p [phone] after 5p, weekends, holidays

## 2017-02-08 NOTE — Patient Instructions (Signed)

## 2017-02-25 ENCOUNTER — Ambulatory Visit (INDEPENDENT_AMBULATORY_CARE_PROVIDER_SITE_OTHER): Payer: Managed Care, Other (non HMO) | Admitting: Internal Medicine

## 2017-02-25 ENCOUNTER — Encounter: Payer: Self-pay | Admitting: Internal Medicine

## 2017-02-25 VITALS — BP 100/62 | HR 56 | Ht 73.0 in | Wt 175.0 lb

## 2017-02-25 DIAGNOSIS — I2510 Atherosclerotic heart disease of native coronary artery without angina pectoris: Secondary | ICD-10-CM

## 2017-02-25 DIAGNOSIS — R931 Abnormal findings on diagnostic imaging of heart and coronary circulation: Secondary | ICD-10-CM

## 2017-02-25 DIAGNOSIS — E785 Hyperlipidemia, unspecified: Secondary | ICD-10-CM | POA: Diagnosis not present

## 2017-02-25 NOTE — Patient Instructions (Signed)
Your physician recommends that you return for lab work FASTING to check cholesterol   Your physician wants you to follow-up in: ONE YEAR with Dr. Hilty. You will receive a reminder letter in the mail two months in advance. If you don't receive a letter, please call our office to schedule the follow-up appointment.   

## 2017-02-25 NOTE — Progress Notes (Signed)
OFFICE NOTE  Chief Complaint:  Follow-up CT angiogram  Primary Care Physician: Crist Infante, MD  HPI:  Connor Chung is a very pleasant 66 showed male kindly referred to me by Dr. Joylene Draft.  He has a history of dyslipidemia, a family history of coronary disease and dyslipidemia and recently has been having troubles with microscopic hematuria. He underwent a CT scan of the abdomen and pelvis on 06/12/2013 through [facility]. This did not demonstrate any significant abnormalities in the kidneys, however there was coronary atherosclerosis including calcification of the right coronary artery which was incidentally noted. Apparently Connor Chung has a history of septal Q waves seen on his ECG and underwent an echocardiogram which was performed in our office on 09/17/2011. This demonstrated normal systolic and diastolic function. There was mild mitral regurgitation and mild left atrial enlargement. There is borderline aortic root dilatation at 3.9 cm.  He is on Zocor 40 mg daily for his dyslipidemia which is well controlled. His LDL particle number is 965, HDL PD 38-LDL size 21 nmol, total cholesterol 173, triglycerides 71, HDL 55 and LDL 104..  he is active and asymptomatic, denying any chest pain or shortness of breath or limitations to activity.  Connor Chung returns today for followup of his exercise tolerance test. He did very well with the test exercising for 15 minutes and 17 METS. There were no EKG changes noted. He had no chest pain.  Connor Chung was referred back to me today for evaluation of an abnormal EKG. He underwent preoperative testing and an EKG showed marked sinus bradycardia with a PAC. There was concern for possible septal infarct and incomplete right bundle branch block. I compared his EKG with his prior EKG from the office and there is essentially no change other than a heart rate slightly lower. It turns out that he's had some bradycardia in the past which is a general sinus  bradycardia. That being said, as above he exercised quite a lot on his exercise tolerance test without any symptoms. He continues to have no symptoms in fact he has better exercise tolerance now that he's been more regularly exercising that he had when I saw him previously.  12/24/2016  Connor Chung was kindly referred back for evaluation of recent chest pressure. I last saw him in 2016 it was noted to have right coronary artery calcification which was seen on abdominal imaging. He subsequently underwent treadmill exercise stress testing which was negative for ischemia. He has a history of bradycardia in the past but can get his heart rate elevated with exercise. Recently he decided to go on a trip to walk on the [place] trail. This is coming up in the end of September. It will involve some very strenuous hiking. He is been training for that and has become more short of breath and described a chest pressure when using a StairMaster and treadmill at very high inclines. He's also noted when walking up stairs, particularly on a recent trip to [place], he became short of breath and had chest tightness. He had not previously had this symptom in the past. Is noted to be bradycardic today with heart rate in the upper 40s and EKG shows PACs and an incomplete right bundle branch block. Blood pressure is well-controlled.  02/25/2017  Connor Chung returns today for follow-up.  He successfully walked about 150 miles of the [employer].  He denies any chest pain but was short of breath, however thought not to be out of  the ordinary for such a tough hike.  Fortunately he underwent CT coronary angiography.  This was performed on 01/11/2017 demonstrating a calcium score of 210 with a multivessel coronary artery calcium and left main calcification.  He was found to have less than 30% calcified distal stenosis of the left main and less than 30% calcified proximal stenosis of the LAD, 30% stenosis in the left circumflex  and up to 50% stenosis of the dominant RCA.  I shared those findings with him today.  He needs to be asymptomatic however was warned as to what to look for if he were to develop coronary symptoms with exertion.  His last lipid profile shows good control with LDL C of 68 and total cholesterol 138 and HDL 60.  This is on simvastatin and ezetimibe.   PMHx:  Past Medical History:  Diagnosis Date  . Allergic rhinitis   . Hyperlipidemia     Past Surgical History:  Procedure Laterality Date  . 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 CYST REMOVAL NECK  2006   right clavicale  . TRANSTHORACIC ECHOCARDIOGRAM  08/2011   EF=>55%, borderline LA enlargement, RA mildly dilated, mild MR, trace TR, borderline aortic root dilatation   . WISDOM TOOTH EXTRACTION      FAMHx:  Family History  Problem Relation Age of Onset  . Cancer Mother        skin   . Cancer Father        skin   . Heart Problems Maternal Grandmother   . Colon cancer Neg Hx     SOCHx:   reports that he quit smoking about 28 years ago. He has never used smokeless tobacco. He reports that he drinks about 8.4 oz of alcohol per week . He reports that he does not use drugs.  ALLERGIES:  No Known Allergies  ROS: Pertinent items noted in HPI and remainder of comprehensive ROS otherwise negative.  HOME MEDS: Current Outpatient Prescriptions  Medication Sig Dispense Refill  . cholecalciferol (VITAMIN D) 1000 UNITS tablet Take 2,000 Units by mouth daily.    Marland Kitchen ezetimibe (ZETIA) 10 MG tablet Take 5 mg by mouth daily. Takes one half tablet daily    . fluticasone (FLONASE) 50 MCG/ACT nasal spray Place 2 sprays into both nostrils daily.    . Omega-3 Fatty Acids (FISH OIL) 1200 MG CAPS Take 1 capsule by mouth daily.    . simvastatin (ZOCOR) 40 MG tablet Take 40 mg by mouth daily.    . vitamin B-12 (CYANOCOBALAMIN) 100 MCG tablet Take 100 mcg by mouth daily.     No current facility-administered medications for this visit.      LABS/IMAGING: No results found for this or any previous visit (from the past 48 hour(s)). No results found.  VITALS: BP 100/62   Pulse (!) 56   Ht 6\' 1"  (1.854 m)   Wt 175 lb (79.4 kg)   BMI 23.09 kg/m   EXAM: General appearance: alert and no distress Neck: no carotid bruit, no JVD and thyroid not enlarged, symmetric, no tenderness/mass/nodules Lungs: clear to auscultation bilaterally Heart: regular rate and rhythm, S1, S2 normal, no murmur, click, rub or gallop Abdomen: soft, non-tender; bowel sounds normal; no masses,  no organomegaly Extremities: extremities normal, atraumatic, no cyanosis or edema Pulses: 2+ and symmetric Skin: Skin color, texture, turgor normal. No rashes or lesions Neurologic: Grossly normal Psych: Pleasant  EKG: Deferred  ASSESSMENT: 1. Chest pressure-found to moderate nonobstructive calcified coronary artery  disease by CT coronary angiography (12/2016), coronary artery calcium score to 210 2. Abnormal right coronary artery calcium - low risk Bruce treadmill stress test (2016) 3. Dyslipidemia-well controlled 4. 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 Asymptomatic sinus bradycardia  PLAN: 1.   Connor Chung has not had any further anginal symptoms.  He is coronary artery disease is nonobstructive based on CT coronary angiography with calcified lesions in all main vessels.  He is appropriately treated with regards to his lipid profile however may have residual risk.  I recommend an extended lipid profile.  We will check an NMR profile today.  He should continue healthy diet and exercise as well as lifestyle modifications to reduce cardiovascular risk.  Follow-up with me annually or sooner as necessary.  Pixie Casino, MD, [facility] Attending Cardiologist [facility] 02/25/2017, 9:07 AM

## 2017-03-03 LAB — LIPOPROTEIN ANALYSIS BY NMR
HDL Particle Number: 45 umol/L (ref >=30.5)
LDL Particle Number: 824 nmol/L (ref <1000)
LDL SIZE: 20.7 nm (ref >20.5)
LP-IR Score: 26 (ref <=45)
Small LDL Particle Number: 343 nmol/L (ref <=527)

## 2017-03-04 ENCOUNTER — Encounter: Payer: Self-pay | Admitting: Internal Medicine

## 2017-03-10 ENCOUNTER — Telehealth: Payer: Self-pay | Admitting: Internal Medicine

## 2017-03-10 NOTE — Telephone Encounter (Signed)
Called patient and provided sufficient explanation of his lipid NMR results and he is satisfied.  Dr. Lemmie Evens

## 2017-03-10 NOTE — Telephone Encounter (Signed)
Notes recorded by Pixie Casino, MD on 03/03/2017 at 6:14 PM EST LDL particle numbers at goal - no changes to his current regimen. -Dr. Lemmie Evens  Returned call to pt he received letter and is dissatisfied with the result that he received and states that these results do not tell him anything he states that he does understand that the numbers are at goal and to continue current therapy but does this mean that his exercise regimen is keeping him from continuing to have plaque? Doe he need to increase his exercise? Does this mean that his cholesterol goo or just at goal? Should be be doing better? Is he eating correctly? He has many questions and would like further explanation from Dr Debara Pickett.

## 2017-03-10 NOTE — Telephone Encounter (Signed)
New Message   Per pt received letter regarding results, and letter instructed him to call off if he wanted more information regarding those results. Requesting call back

## 2017-05-10 ENCOUNTER — Other Ambulatory Visit: Payer: Self-pay | Admitting: Internal Medicine

## 2017-05-10 DIAGNOSIS — N6320 Unspecified lump in the left breast, unspecified quadrant: Secondary | ICD-10-CM

## 2017-05-10 DIAGNOSIS — N6310 Unspecified lump in the right breast, unspecified quadrant: Secondary | ICD-10-CM

## 2017-05-24 ENCOUNTER — Other Ambulatory Visit: Payer: Managed Care, Other (non HMO)

## 2017-05-27 ENCOUNTER — Ambulatory Visit
Admission: RE | Admit: 2017-05-27 | Discharge: 2017-05-27 | Disposition: A | Discharge status: Home / Self Care | Payer: Managed Care, Other (non HMO) | Source: Ambulatory Visit | Attending: Internal Medicine | Admitting: Internal Medicine

## 2017-05-27 DIAGNOSIS — N6310 Unspecified lump in the right breast, unspecified quadrant: Secondary | ICD-10-CM

## 2017-05-27 DIAGNOSIS — N6320 Unspecified lump in the left breast, unspecified quadrant: Secondary | ICD-10-CM

## 2017-07-01 ENCOUNTER — Telehealth: Payer: Self-pay | Admitting: Gastroenterology

## 2017-07-01 NOTE — Telephone Encounter (Signed)
Spoke with the patient about his hemorrhoids. He reports blood with bowel movements. He has itching at the rectum. No pain with bowel movement. He decline any treatment such as cream or suppository.He would like to come back in to repeat the banding. He states he does not think the band stayed on the length of time it was supposed to at the last treatment. He feels this procedure needs to be repeated. Appointment is scheduled for the first available.

## 2017-07-01 NOTE — Telephone Encounter (Signed)
No answer. Left message on the voicemail.

## 2017-07-25 ENCOUNTER — Telehealth: Payer: Self-pay | Admitting: Gastroenterology

## 2017-07-25 NOTE — Telephone Encounter (Signed)
Benefiber 1 tablespoon 2-3 times daily and add Miralax 1/2 capful daily at bedtime if needed.

## 2017-07-25 NOTE — Telephone Encounter (Signed)
Should he pre-treat in anticipation of being constipated?

## 2017-07-25 NOTE — Telephone Encounter (Signed)
Spoke with the patient about the recommendations. He will follow these recommendations and call with any questions or problems.

## 2017-08-08 ENCOUNTER — Ambulatory Visit: Payer: Managed Care, Other (non HMO) | Admitting: Gastroenterology

## 2017-08-08 ENCOUNTER — Encounter: Payer: Self-pay | Admitting: Gastroenterology

## 2017-08-08 VITALS — BP 112/70 | HR 69 | Ht 73.0 in | Wt 177.0 lb

## 2017-08-08 DIAGNOSIS — K649 Unspecified hemorrhoids: Secondary | ICD-10-CM

## 2017-08-08 DIAGNOSIS — M6289 Other specified disorders of muscle: Secondary | ICD-10-CM

## 2017-08-08 NOTE — Progress Notes (Signed)
PROCEDURE NOTE: The patient presents with symptomatic grade II  hemorrhoids, requesting rubber band ligation of his/her hemorrhoidal disease.  All risks, benefits and alternative forms of therapy were described and informed consent was obtained.  In the Left Lateral Decubitus position anoscopic examination revealed grade II hemorrhoids in the Right anterior position(s).  The anorectum was pre-medicated with 0.125% Nitroglycerine and Recticare The decision was made to band the Right anterior internal hemorrhoid, and the [place] was used to perform band ligation without complication.  Digital anorectal examination was then performed to assure proper positioning of the band, and to adjust the banded tissue as required.  The patient was discharged home without pain or other issues.  Dietary and behavioral recommendations were given and along with follow-up instructions.     The following adjunctive treatments were recommended:  Fiber choice 2 tablets daily Refer to pelvic floor PT for biofeedback (pelvic outlet dysfunction)  The patient will return  follow-up and possible additional banding as required. No complications were encountered and the patient tolerated the procedure well.   Damaris Hippo , MD [phone]

## 2017-08-08 NOTE — Patient Instructions (Addendum)
If you are age 60 or older, your body mass index should be between 23-30. Your Body mass index is 23.35 kg/m. If this is out of the aforementioned range listed, please consider follow up with your Primary Care Provider.  If you are age 72 or younger, your body mass index should be between 19-25. Your Body mass index is 23.35 kg/m. If this is out of the aformentioned range listed, please consider follow up with your Primary Care Provider.   You have been given samples of Fiberchoice take 2-3 daily.  You have been referred for Pelvic Floor Therapy.  They will contact you regarding this appointment.  HEMORRHOID BANDING PROCEDURE    FOLLOW-UP CARE   1. The procedure you have had should have been relatively painless since the banding of the area involved does not have nerve endings and there is no pain sensation.  The rubber band cuts off the blood supply to the hemorrhoid and the band may fall off as soon as 48 hours after the banding (the band may occasionally be seen in the toilet bowl following a bowel movement). You may notice a temporary feeling of fullness in the rectum which should respond adequately to plain Tylenol or Motrin.  2. Following the banding, avoid strenuous exercise that evening and resume full activity the next day.  A sitz bath (soaking in a warm tub) or bidet is soothing, and can be useful for cleansing the area after bowel movements.     3. To avoid constipation, take two tablespoons of natural wheat bran, natural oat bran, flax, Benefiber or any over the counter fiber supplement and increase your water intake to 7-8 glasses daily.    4. Unless you have been prescribed anorectal medication, do not put anything inside your rectum for two weeks: No suppositories, enemas, fingers, etc.  5. Occasionally, you may have more bleeding than usual after the banding procedure.  This is often from the untreated hemorrhoids rather than the treated one.  Don't be concerned if there  is a tablespoon or so of blood.  If there is more blood than this, lie flat with your bottom higher than your head and apply an ice pack to the area. If the bleeding does not stop within a half an hour or if you feel faint, call our office at [phone] or go to the emergency room.  6. Problems are not common; however, if there is a substantial amount of bleeding, severe pain, chills, fever or difficulty passing urine (very rare) or other problems, you should call us at [phone] or report to the nearest emergency room.  7. Do not stay seated continuously for more than 2-3 hours for a day or two after the procedure.  Tighten your buttock muscles 10-15 times every two hours and take 10-15 deep breaths every 1-2 hours.  Do not spend more than a few minutes on the toilet if you cannot empty your bowel; instead re-visit the toilet at a later time.    Thank you for choosing me and [facility].   Pat Kocher, MD

## 2017-09-05 ENCOUNTER — Encounter: Payer: Self-pay | Admitting: Physical Therapy

## 2017-09-05 ENCOUNTER — Other Ambulatory Visit: Payer: Self-pay

## 2017-09-05 ENCOUNTER — Telehealth: Payer: Self-pay | Admitting: Gastroenterology

## 2017-09-05 ENCOUNTER — Ambulatory Visit: Payer: Managed Care, Other (non HMO) | Attending: Gastroenterology | Admitting: Physical Therapy

## 2017-09-05 DIAGNOSIS — R252 Cramp and spasm: Secondary | ICD-10-CM

## 2017-09-05 DIAGNOSIS — R279 Unspecified lack of coordination: Secondary | ICD-10-CM | POA: Diagnosis present

## 2017-09-05 DIAGNOSIS — M6289 Other specified disorders of muscle: Secondary | ICD-10-CM

## 2017-09-05 DIAGNOSIS — R151 Fecal smearing: Secondary | ICD-10-CM

## 2017-09-05 NOTE — Therapy (Addendum)
[facility] [address], [place], [place], [address] Phone: [phone]   Fax:  [phone]  Physical Therapy Evaluation  Patient Details  Name: Connor Chung MRN: [id number] Date of Birth: 1955-12-18 Referring Provider: Greggory Keen, LPN   Encounter Date: 09/05/2017  PT End of Session - 09/05/17 1122    Visit Number  [id number]    Date for PT Re-Evaluation  10/17/17    PT Start Time  1102    PT Stop Time  1147    PT Time Calculation (min)  45 min    Activity Tolerance  Patient tolerated treatment well    Behavior During Therapy  [facility] for tasks assessed/performed       Past Medical History:  Diagnosis Date  . Allergic rhinitis   . Hyperlipidemia     Past Surgical History:  Procedure Laterality Date  . 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 CYST REMOVAL NECK  2006   right clavicale  . TRANSTHORACIC ECHOCARDIOGRAM  08/2011   EF=>55%, borderline LA enlargement, RA mildly dilated, mild MR, trace TR, borderline aortic root dilatation   . WISDOM TOOTH EXTRACTION      There were no vitals filed for this visit.   Subjective Assessment - 09/05/17 1109    Subjective  Pt had banding for hemorroids.  States he had a little leakage a couple days after the banding pt had some leakage.  Having leakage every few days, small amount.  States.  BM 1x / day, some difficulty evacuating.     Patient Stated Goals  get rid of leakage;     Currently in Pain?  No/denies         [facility] PT Assessment - 09/05/17 0001      Assessment   Medical Diagnosis  M62.89 (ICD-10-CM) - Pelvic floor dysfunction; R15.1 (ICD-10-CM) - Fecal smearing    Referring Provider  Harl Bowie V    Prior Therapy  No      Precautions   Precautions  None      Restrictions   Weight Bearing Restrictions  No      Balance Screen   Has the patient fallen in the past 6 months  No      [place]  residence    Living Arrangements  Spouse/significant other      Prior Function   Level of Independence  Independent    Vocation  Full time employment    Vocation Requirements  desk work      Cognition   Overall Cognitive Status  Within Functional Limits for tasks assessed      Posture/Postural Control   Posture/Postural Control  Postural limitations    Postural Limitations  Rounded Shoulders      ROM / Strength   AROM / PROM / Strength  AROM;Strength      Ambulation/Gait   Gait Pattern  Within Functional Limits                Objective measurements completed on examination: See above findings.    Pelvic Floor Special Questions - 09/05/17 0001    Prior Pelvic/Prostate Exam  Yes annual    Currently Sexually Active  Yes    Is this Painful  No    Fecal incontinence  Yes feels a little sharp with BM    Falling out feeling (prolapse)  No    Skin Integrity  Other;Irritaion present at  Skin Integrity other  thin skin around anus    Skin Integrity Irritation Present at  external anal sphincter    Pelvic Floor Internal Exam  pt informed and consent given to perform internal assessment    Exam Type  Rectal    Sensation  normal/hypersensative    Palpation  puburectalis tight and tender to palpation    Strength  good squeeze, good lift, able to hold agaisnt strong resistance    Strength # of reps  5    Strength # of seconds  3    Tone  2.76mV resting    Biofeedback  quick contract 11.52mV average; 15.64mV hold, 20 sec hold 12.94mV    Biofeedback sensor type  Surface               PT Education - 09/05/17 1816    Education provided  Yes    Education Details  toileting technique and ab massage    Person(s) Educated  Patient    Methods  Explanation;Demonstration;Verbal cues;Handout    Comprehension  Verbalized understanding;Returned demonstration         Plan - 09/06/17 0806    Clinical Impression Statement  Pt presents to skilled PT due to fecal  leakage.   Pt recently had banding procedure for hemorrhoids which seems to  have helped but is still experiencing leakage every few days.  when he  experiences leakage, it is always within a few hours after a BM.  Pt  states he is still pushing a little to have bowel movement and finds it  difficult to relax.  He reports having pain with BM as well.  Upon  assessing pelvic floor rectally, pt has very tight puborectalis, internal  and external anal sphincters.  MMT shows 4/5 strength.  He has decreased  enduranced deomonstrated by biofeedback as shown above.  Pt is unable to  coordinate muscles to push one finger out.  Pt will benefit from skilled  PT to improve muscle coordination and endurance for improved ability to  perform self care activiites.      History and Personal Factors relevant to plan of care:  hemorrhoids    Clinical Presentation  Stable    Clinical Presentation due to:  pt is stable    Clinical Decision Making  Low    Rehab Potential  Excellent    PT Frequency  2x / week    PT Duration  8 weeks    PT Treatment/Interventions  ADLs/Self Care Home  Management;Biofeedback;Cryotherapy;Electrical Stimulation;Moist  Heat;Functional mobility training;Therapeutic activities;Therapeutic  exercise;Neuromuscular re-education;Patient/family education;Manual  techniques;Taping;Dry needling    Consulted and Agree with Plan of Care  Patient      Patient will benefit from skilled therapeutic intervention in order to improve the following deficits and impairments:  Increased muscle spasms, Decreased skin integrity, Impaired tone, Decreased strength, Decreased coordination, Pain  Visit Diagnosis: Cramp and spasm  (primary encounter diagnosis)  Unspecified lack of coordination   PT Short Term Goals - 09/06/17 1543      PT SHORT TERM GOAL #1   Title  ind with initial HEP    Time  4    Period  Weeks    Status  New    Target Date  10/04/17      PT SHORT TERM GOAL #2   Title  pt will be  able to use toilet techniques and breathing correctly  for less straining during BM    Time  4    Period  Weeks  Status  New    Target Date  10/04/17      PT Long Term Goals - 09/06/17 1544      PT LONG TERM GOAL #1   Title  pt will be ind with advanced HEP    Time  8    Period  Weeks    Status  New    Target Date  10/31/17      PT LONG TERM GOAL #2   Title  Pt will report no leakage or smearing for at least 2 weeks    Time  8    Period  Weeks    Status  New    Target Date  10/31/17      PT LONG TERM GOAL #3   Title  Pt will be able to push one finger from rectum due to improve  muscle coordination    Time  8    Period  Weeks    Status  New    Target Date  10/31/17      PT LONG TERM GOAL #4   Title  Pt will have 50% less tenderness and trigger points along  puborectalis due to improved soft tissue length in order to have BM with  less pain    Time  8    Period  Weeks    Status  New    Target Date  10/31/17             Patient will benefit from skilled therapeutic intervention in order to improve the following deficits and impairments:     Visit Diagnosis: Cramp and spasm  Unspecified lack of coordination     Problem List Patient Active Problem List   Diagnosis Date Noted  . CAD, multiple vessel 02/25/2017  . Exertional chest pain 12/24/2016  . Bradycardia 12/24/2016  . Contusion   . Syncope 09/25/2015  . Normocytic anemia 09/25/2015  . Occipital scalp laceration 09/25/2015  . Sinus bradycardia 07/16/2014  . Dyslipidemia 08/01/2013  . Elevated coronary artery calcium score 08/01/2013    Zannie Cove, PT 09/05/2017, 6:22 PM  [facility] [address] [place], [place], [address] Phone: [phone]   Fax:  [phone]  Name: Connor Chung MRN: [id number] Date of Birth: [birth year]

## 2017-09-05 NOTE — Telephone Encounter (Signed)
Spoke with the patient. He states the problem is the same and persistent. He did not schedule an appointment with physical therapy. States he "didn't really understand what it was for" and therefore he "did not call them back." Briefly discussed the goal of pelvic floor strengthening . He expresses understanding and a willingness to try this. New referral put in Epic.

## 2017-09-05 NOTE — Patient Instructions (Signed)
Toileting Techniques for Bowel Movements (Defecation) Using your belly (abdomen) and pelvic floor muscles to have a bowel movement is usually instinctive.  Sometimes people can have problems with these muscles and have to relearn proper defecation (emptying) techniques.  If you have weakness in your muscles, organs that are falling out, decreased sensation in your pelvis, or ignore your urge to go, you may find yourself straining to have a bowel movement.  You are straining if you are: . holding your breath or taking in a huge gulp of air and holding it  . keeping your lips and jaw tensed and closed tightly . turning red in the face because of excessive pushing or forcing . developing or worsening your  hemorrhoids . getting faint while pushing . not emptying completely and have to defecate many times a day  If you are straining, you are actually making it harder for yourself to have a bowel movement.  Many people find they are pulling up with the pelvic floor muscles and closing off instead of opening the anus. Due to lack pelvic floor relaxation and coordination the abdominal muscles, one has to work harder to push the feces out.  Many people have never been taught how to defecate efficiently and effectively.  Notice what happens to your body when you are having a bowel movement.  While you are sitting on the toilet pay attention to the following areas: . Jaw and mouth position . Angle of your hips   . Whether your feet touch the ground or not . Arm placement  . Spine position . Waist . Belly tension . Anus (opening of the anal canal)  An Evacuation/Defecation Plan   Here are the 4 basic points:  1. Lean forward enough for your elbows to rest on your knees 2. Support your feet on the floor or use a low stool if your feet don't touch the floor  3. Push out your belly as if you have swallowed a beach ball-you should feel a widening of your waist 4. Open and relax your pelvic floor muscles,  rather than tightening around the anus   About Abdominal Massage  Abdominal massage, also called external colon massage, is a self-treatment circular massage technique that can reduce and eliminate gas and ease constipation. The colon naturally contracts in waves in a clockwise direction starting from inside the right hip, moving up toward the ribs, across the belly, and down inside the left hip.  When you perform circular abdominal massage, you help stimulate your colon's normal wave pattern of movement called peristalsis.  It is most beneficial when done after eating.  Positioning You can practice abdominal massage with oil while lying down, or in the shower with soap.  Some people find that it is just as effective to do the massage through clothing while sitting or standing.  How to Massage Start by placing your finger tips or knuckles on your right side, just inside your hip bone.  . Make small circular movements while you move upward toward your rib cage.   . Once you reach the bottom right side of your rib cage, take your circular movements across to the left side of the bottom of your rib cage.  . Next, move downward until you reach the inside of your left hip bone.  This is the path your feces travel in your colon. . Continue to perform your abdominal massage in this pattern for 10 minutes each day.     You can apply  as much pressure as is comfortable in your massage.  Start gently and build pressure as you continue to practice.  Notice any areas of pain as you massage; areas of slight pain may be relieved as you massage, but if you have areas of significant or intense pain, consult with your healthcare provider.  Other Considerations . General physical activity including bending and stretching can have a beneficial massage-like effect on the colon.  Deep breathing can also stimulate the colon because breathing deeply activates the same nervous system that supplies the colon.    . Abdominal massage should always be used in combination with a bowel-conscious diet that is high in the proper type of fiber for you, fluids (primarily water), and a regular exercise program.    The following conditions my require modifications to your toileting posture:  . If you have had surgery in the past that limits your back, hip, pelvic, knee or ankle flexibility . Constipation   Your healthcare practitioner may make the following additional suggestions and adjustments:  1) Sit on the toilet  a) Make sure your feet are supported. b) Notice your hip angle and spine position-most people find it effective to lean forward or raise their knees, which can help the muscles around the anus to relax  c) When you lean forward, place your forearms on your thighs for support  2) Relax suggestions a) Breath deeply in through your nose and out slowly through your mouth as if you are smelling the flowers and blowing out the candles. b) To become aware of how to relax your muscles, contracting and releasing muscles can be helpful.  Pull your pelvic floor muscles in tightly by using the image of holding back gas, or closing around the anus (visualize making a circle smaller) and lifting the anus up and in.  Then release the muscles and your anus should drop down and feel open. Repeat 5 times ending with the feeling of relaxation. c) Keep your pelvic floor muscles relaxed; let your belly bulge out. d) The digestive tract starts at the mouth and ends at the anal opening, so be sure to relax both ends of the tube.  Place your tongue on the roof of your mouth with your teeth separated.  This helps relax your mouth and will help to relax the anus at the same time.  3) Empty (defecation) a) Keep your pelvic floor and sphincter relaxed, then bulge your anal muscles.  Make the anal opening wide.  b) Stick your belly out as if you have swallowed a beach ball. c) Make your belly wall hard using your belly  muscles while continuing to breathe. Doing this makes it easier to open your anus. d) Breath out and give a grunt (or try using other sounds such as ahhhh, shhhhh, ohhhh or grrrrrrr).  4) Finish a) As you finish your bowel movement, pull the pelvic floor muscles up and in.  This will leave your anus in the proper place rather than remaining pushed out and down. If you leave your anus pushed out and down, it will start to feel as though that is normal and give you incorrect signals about needing to have a bowel movement.    [facility] [address] [place], [place] [address]

## 2017-09-06 NOTE — Addendum Note (Signed)
Addended by: Lovett Calender D on: 09/06/2017 03:58 PM   Modules accepted: Orders

## 2017-09-07 ENCOUNTER — Ambulatory Visit: Payer: Managed Care, Other (non HMO) | Admitting: Physical Therapy

## 2017-09-07 ENCOUNTER — Encounter: Payer: Self-pay | Admitting: Physical Therapy

## 2017-09-07 DIAGNOSIS — R252 Cramp and spasm: Secondary | ICD-10-CM

## 2017-09-07 DIAGNOSIS — R279 Unspecified lack of coordination: Secondary | ICD-10-CM

## 2017-09-07 NOTE — Therapy (Signed)
[facility] [address] [place], [place], [address] Phone: [phone]   Fax:  [phone]  Physical Therapy Treatment  Patient Details  Name: Shloimy Michalski MRN: [id number] Date of Birth: 15-Jun-1955 Referring Provider: Mauri Pole   Encounter Date: 09/07/2017  PT End of Session - 09/07/17 1403    Visit Number  2    Date for PT Re-Evaluation  10/17/17    PT Start Time  5188    PT Stop Time  1447    PT Time Calculation (min)  44 min    Activity Tolerance  Patient tolerated treatment well    Behavior During Therapy  [facility] for tasks assessed/performed       Past Medical History:  Diagnosis Date  . Allergic rhinitis   . Hyperlipidemia     Past Surgical History:  Procedure Laterality Date  . 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 CYST REMOVAL NECK  2006   right clavicale  . TRANSTHORACIC ECHOCARDIOGRAM  08/2011   EF=>55%, borderline LA enlargement, RA mildly dilated, mild MR, trace TR, borderline aortic root dilatation   . WISDOM TOOTH EXTRACTION      There were no vitals filed for this visit.  Subjective Assessment - 09/07/17 1404    Subjective  Pt states he is still noticing some stuff coming out after having a bowel movement.  It is not like stool but feels like it is more reinjury from where the banding procedure was.      Patient Stated Goals  get rid of leakage;     Currently in Pain?  No/denies                       [facility] Adult PT Treatment/Exercise - 09/07/17 0001      Neuro Re-ed    Neuro Re-ed Details   biofeedback with toileting technique and breathing and bulging for pelvic floor downtraining; tactile feedback on TP with patient in sitting and breathing into fingers               PT Short Term Goals - 09/07/17 1455      PT SHORT TERM GOAL #1   Title  ind with initial HEP    Time  4    Period  Weeks    Status  On-going      PT SHORT TERM GOAL #2    Title  pt will be able to use toilet techniques and breathing correctly for less straining during BM    Time  4    Period  Weeks    Status  On-going        PT Long Term Goals - 09/06/17 1544      PT LONG TERM GOAL #1   Title  pt will be ind with advanced HEP    Time  8    Period  Weeks    Status  New    Target Date  11/01/17      PT LONG TERM GOAL #2   Title  Pt will report no leakage or smearing for at least 2 weeks    Time  8    Period  Weeks    Status  New    Target Date  11/01/17      PT LONG TERM GOAL #3   Title  Pt will be able to push one finger from rectum due to improve muscle coordination    Time  8    Period  Weeks    Status  New    Target Date  11/01/17      PT LONG TERM GOAL #4   Title  Pt will have 50% less tenderness and trigger points along puborectalis due to improved soft tissue length in order to have BM with less pain    Time  8    Period  Weeks    Status  New    Target Date  11/01/17            Plan - 09/07/17 1448    Clinical Impression Statement  Pt was able to demonstrate correct toileting techniques.  He has difficulty and needed a lot of cues to demonstrate bulging while pushing.  Pt has tight hips and lumbar spine.  He was educated in stretches for improved mobility.  Pt will benefit from skilled PT to work on redcued muscle spasms and improved muscle coordination for greater ease of having a bowel movement    PT Treatment/Interventions  ADLs/Self Care Home Management;Biofeedback;Cryotherapy;Electrical Stimulation;Moist Heat;Functional mobility training;Therapeutic activities;Therapeutic exercise;Neuromuscular re-education;Patient/family education;Manual techniques;Taping;Dry needling    PT Next Visit Plan  STM to puborectalis, breathing and bulging, downtraining    PT Home Exercise Plan   Access Code: [email or web address]     Consulted and Agree with Plan of Care  Patient       Patient will benefit from skilled therapeutic intervention in order  to improve the following deficits and impairments:  Increased muscle spasms, Decreased skin integrity, Impaired tone, Decreased strength, Decreased coordination, Pain  Visit Diagnosis: Cramp and spasm  Unspecified lack of coordination     Problem List Patient Active Problem List   Diagnosis Date Noted  . CAD, multiple vessel 02/25/2017  . Exertional chest pain 12/24/2016  . Bradycardia 12/24/2016  . Contusion   . Syncope 09/25/2015  . Normocytic anemia 09/25/2015  . Occipital scalp laceration 09/25/2015  . Sinus bradycardia 07/16/2014  . Dyslipidemia 08/01/2013  . Elevated coronary artery calcium score 08/01/2013    Zannie Cove, PT 09/07/2017, 3:03 PM  [facility] [address] [place], [place], [address] Phone: [phone]   Fax:  [phone]  Name: Jessup Ogas MRN: [id number] Date of Birth: 1955/09/18

## 2017-09-07 NOTE — Patient Instructions (Signed)
Access Code: [facility]  URL: [email or web address]  Date: 09/07/2017  Prepared by: Lovett Calender   Exercises  Standing Hamstring Stretch on Chair - 10 reps - 3 sets - 30 sec hold - 1x daily - 7x weekly  Seated Piriformis Stretch with Trunk Bend - 10 reps - 1 sets - 1x daily - 7x weekly  Side Lunge Adductor Stretch - 3 reps - 1 sets - 30 sec hold - 1x daily - 7x weekly  Adductor Stretch on Chair - 3 reps - 1 sets - 30 sec hold - 1x daily - 7x weekly

## 2017-09-12 ENCOUNTER — Ambulatory Visit: Payer: Managed Care, Other (non HMO) | Admitting: Physical Therapy

## 2017-09-12 DIAGNOSIS — R252 Cramp and spasm: Secondary | ICD-10-CM | POA: Diagnosis not present

## 2017-09-12 DIAGNOSIS — R279 Unspecified lack of coordination: Secondary | ICD-10-CM

## 2017-09-12 NOTE — Therapy (Signed)
[facility] [address], [place], [place], [address] Phone: [phone]   Fax:  [phone]  Physical Therapy Treatment  Patient Details  Name: Connor Chung MRN: [id number] Date of Birth: Sep 01, 1955 Referring Provider: Mauri Pole   Encounter Date: 09/12/2017  PT End of Session - 09/12/17 1105    Visit Number  3    Date for PT Re-Evaluation  10/17/17    PT Start Time  1021    PT Stop Time  1100    PT Time Calculation (min)  39 min    Activity Tolerance  Patient tolerated treatment well    Behavior During Therapy  [facility] for tasks assessed/performed       Past Medical History:  Diagnosis Date  . Allergic rhinitis   . Hyperlipidemia     Past Surgical History:  Procedure Laterality Date  . 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 CYST REMOVAL NECK  2006   right clavicale  . TRANSTHORACIC ECHOCARDIOGRAM  08/2011   EF=>55%, borderline LA enlargement, RA mildly dilated, mild MR, trace TR, borderline aortic root dilatation   . WISDOM TOOTH EXTRACTION      There were no vitals filed for this visit.  Subjective Assessment - 09/12/17 1027    Subjective  Pt feels like the issue has resolved and he hasn't had the issue the last several days    Patient Stated Goals  get rid of leakage;     Currently in Pain?  No/denies                       [facility] Adult PT Treatment/Exercise - 09/12/17 0001      Exercises   Exercises  Other Exercises    Other Exercises   pelvic floor, lumbar stretches as seen in chart with breathing for pelvic floor relaxation      Manual Therapy   Manual Therapy  Soft tissue mobilization    Soft tissue mobilization  lumbar paraspinals and glutes; soft tissue mobs and myofascial release             PT Education - 09/12/17 1057    Education provided  Yes    Education Details   Access Code: [email or web address] and HEP in chart    Person(s) Educated  Patient    Methods  Explanation;Demonstration;Handout    Comprehension  Verbalized understanding;Returned demonstration       PT Short Term Goals - 09/12/17 1111      PT SHORT TERM GOAL #1   Title  ind with initial HEP    Time  4    Period  Weeks    Status  Achieved      PT SHORT TERM GOAL #2   Title  pt will be able to use toilet techniques and breathing correctly for less straining during BM    Status  Achieved        PT Long Term Goals - 09/12/17 1110      PT LONG TERM GOAL #1   Title  pt will be ind with advanced HEP    Status  On-going      PT LONG TERM GOAL #2   Title  Pt will report no leakage or smearing for at least 2 weeks    Baseline  no leakage for last few days    Status  Partially Met      PT LONG TERM GOAL #3   Title  Pt will be able to push one finger from rectum due to improve muscle coordination    Baseline  able to have normal BM with breathing and relaxation techniques    Status  Achieved      PT LONG TERM GOAL #4   Title  Pt will have 50% less tenderness and trigger points along puborectalis due to improved soft tissue length in order to have BM with less pain    Baseline  no pain currently    Status  Achieved            Plan - 09/12/17 1106    Clinical Impression Statement  Pt has met most of his goals at this time.  He reports he is more aware when on the toilet and not had any leakage the last several days.  He demonstrates good technique when educated on exercises and he was able to     Rehab Potential  Excellent    PT Treatment/Interventions  ADLs/Self Care Home Management;Biofeedback;Cryotherapy;Electrical Stimulation;Moist Heat;Functional mobility training;Therapeutic activities;Therapeutic exercise;Neuromuscular re-education;Patient/family education;Manual techniques;Taping;Dry needling    PT Next Visit Plan  f/u on response to stretches and discharge if continues to have no issues and able to continue with HEP    PT Home Exercise Plan  Access  Code: [email or web address]    Consulted and Agree with Plan of Care  Patient       Patient will benefit from skilled therapeutic intervention in order to improve the following deficits and impairments:  Increased muscle spasms, Decreased skin integrity, Impaired tone, Decreased strength, Decreased coordination, Pain  Visit Diagnosis: Cramp and spasm  Unspecified lack of coordination     Problem List Patient Active Problem List   Diagnosis Date Noted  . CAD, multiple vessel 02/25/2017  . Exertional chest pain 12/24/2016  . Bradycardia 12/24/2016  . Contusion   . Syncope 09/25/2015  . Normocytic anemia 09/25/2015  . Occipital scalp laceration 09/25/2015  . Sinus bradycardia 07/16/2014  . Dyslipidemia 08/01/2013  . Elevated coronary artery calcium score 08/01/2013    Zannie Cove, PT 09/12/2017, 11:17 AM  [facility] [address], [place], [place], [address] Phone: [phone]   Fax:  [phone]  Name: Connor Chung MRN: [id number] Date of Birth: Sep 23, 1955

## 2017-09-12 NOTE — Patient Instructions (Addendum)
  Position yourself as shown grabbing onto feet or behind the knees. You should feel a gentle stretch. Breathe in and allow the pelvic floor muscles to relax. Hold 1 min. 2 times per day.  Adductors, Frog Squat    Crouch with elbows inside knees . Gently push knees outward. Hold _30__ seconds.1 Repeat __1_ times per session. Do _2__ sessions per day.  Copyright  VHI. All rights reserved.   BACK: Child's Pose (Sciatica)    Sit in knee-chest position and reach arms forward. Separate knees for comfort. Hold position for _60__ seconds. Repeat _2__ times. Do _2__ times per day.  Copyright  VHI. All rights reserved.   Piriformis Stretch    Lying on back, pull right knee toward opposite shoulder. Hold _30___ seconds. Repeat __2__ times. Do _1___ sessions per day.  http://[email or web address].[email or web address]   Copyright  VHI. All rights reserved.   Piriformis Stretch, Supine    Lie supine, one ankle crossed onto opposite knee. Holding bottom leg behind knee, gently pull legs toward chest until stretch is felt in buttock of top leg. Hold _30__ seconds. For deeper stretch gently push top knee away from body.  Repeat _2__ times per session. Do _2__ sessions per day.  Copyright  VHI. All rights reserved.    Supine Knee-to-Chest, Unilateral    Lie on back, hands clasped behind one knee. Pull knee in toward chest until a comfortable stretch is felt in lower back and buttocks. Hold 30___ seconds.  Repeat __2_ times per session. Do _1__ sessions per day.     Butterfly, Supine    Lie on back, feet together. Lower knees toward floor. Hold 60___ seconds. Repeat _2__ times per session. Do _1__ sessions per day.  Copyright  VHI. All rights reserved.   Access Code: [email or web address]  URL: [email or web address]  Date: 09/12/2017  Prepared by: Lovett Calender   Exercises  Sidelying Thoracic Rotation with Open Book - 3 reps - 1 sets - 30 hold - 1x daily - 7x weekly  Cat-Camel - 10  reps - 3 sets - 1x daily - 7x weekly  Supine Lower Trunk Rotation - 10 reps - 3 sets - 1x daily - 7x weekly    [facility] [address], [place], [place] [address] Phone # [phone] Fax [phone]

## 2017-09-14 ENCOUNTER — Encounter: Payer: Managed Care, Other (non HMO) | Admitting: Physical Therapy

## 2017-09-28 ENCOUNTER — Encounter: Payer: Self-pay | Admitting: Physical Therapy

## 2017-09-29 ENCOUNTER — Ambulatory Visit: Payer: Managed Care, Other (non HMO) | Attending: Gastroenterology | Admitting: Physical Therapy

## 2017-09-29 ENCOUNTER — Encounter: Payer: Self-pay | Admitting: Physical Therapy

## 2017-09-29 DIAGNOSIS — R279 Unspecified lack of coordination: Secondary | ICD-10-CM

## 2017-09-29 DIAGNOSIS — R252 Cramp and spasm: Secondary | ICD-10-CM | POA: Diagnosis present

## 2017-09-29 NOTE — Patient Instructions (Signed)
   Or dilators look amazon  [email or web address] for above picture

## 2017-09-29 NOTE — Therapy (Signed)
[facility] [address], [place], [place], [address] Phone: [phone]   Fax:  [phone]  Physical Therapy Treatment  Patient Details  Name: Justyce Baby MRN: [id number] Date of Birth: 05/10/1955 Referring Provider: Mauri Pole   Encounter Date: 09/29/2017  PT End of Session - 09/29/17 0810    Visit Number  4    Date for PT Re-Evaluation  10/17/17    PT Start Time  0804    PT Stop Time  0853    PT Time Calculation (min)  49 min    Activity Tolerance  Patient tolerated treatment well    Behavior During Therapy  [facility] for tasks assessed/performed       Past Medical History:  Diagnosis Date  . Allergic rhinitis   . Hyperlipidemia     Past Surgical History:  Procedure Laterality Date  . 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 CYST REMOVAL NECK  2006   right clavicale  . TRANSTHORACIC ECHOCARDIOGRAM  08/2011   EF=>55%, borderline LA enlargement, RA mildly dilated, mild MR, trace TR, borderline aortic root dilatation   . WISDOM TOOTH EXTRACTION      There were no vitals filed for this visit.  Subjective Assessment - 09/29/17 0807    Subjective  Pt states he started having the leakage that is on and off.  States he had more issues possibly when his bowel movement was more soft    Patient Stated Goals  get rid of leakage;     Currently in Pain?  No/denies                       [facility] Adult PT Treatment/Exercise - 09/29/17 0001      Self-Care   Self-Care  Other Self-Care Comments    Other Self-Care Comments   dilator and rectal probe for self massage and stretch      Neuro Re-ed    Neuro Re-ed Details   tactile cues for anal sphincter and puborectalis coordination with contract and relax; breathing with coordination of muscle contractions      Manual Therapy   Manual Therapy  Internal Pelvic Floor    Manual therapy comments  pt informed and consent given to perform  internal soft release to internal soft tissue    Internal Pelvic Floor  anal sphincter and puborectalis, levator ani bilaterally              PT Education - 09/29/17 1101    Education provided  Yes    Education Details  info on using rectal dilator for stretching and self massage    Person(s) Educated  Patient    Methods  Explanation    Comprehension  Verbalized understanding       PT Short Term Goals - 09/12/17 1111      PT SHORT TERM GOAL #1   Title  ind with initial HEP    Time  4    Period  Weeks    Status  Achieved      PT SHORT TERM GOAL #2   Title  pt will be able to use toilet techniques and breathing correctly for less straining during BM    Status  Achieved        PT Long Term Goals - 09/12/17 1110      PT LONG TERM GOAL #1   Title  pt will be ind with advanced HEP    Status  On-going      PT LONG TERM GOAL #2   Title  Pt will report no leakage or smearing for at least 2 weeks    Baseline  no leakage for last few days    Status  Partially Met      PT LONG TERM GOAL #3   Title  Pt will be able to push one finger from rectum due to improve muscle coordination    Baseline  able to have normal BM with breathing and relaxation techniques    Status  Achieved      PT LONG TERM GOAL #4   Title  Pt will have 50% less tenderness and trigger points along puborectalis due to improved soft tissue length in order to have BM with less pain    Baseline  no pain currently    Status  Achieved            Plan - 09/29/17 0930    Clinical Impression Statement  Pt has muscle spasms throughout puborectalis.  He has some release and increased soft tissue length with manaul treatments.  He responded well to tactile cues to contract puborectalis.  Verbal cues needed to relax sphincter muscles.  Pt was educated on purchasing rectal sensor for using in clinic as well as to use at home to practice pushing correctly    PT Treatment/Interventions  ADLs/Self Care Home  Management;Biofeedback;Cryotherapy;Electrical Stimulation;Moist Heat;Functional mobility training;Therapeutic activities;Therapeutic exercise;Neuromuscular re-education;Patient/family education;Manual techniques;Taping;Dry needling    PT Next Visit Plan  f/u on self massage and biofeedback for improved coordination    PT Home Exercise Plan  Access Code: [email or web address]    Consulted and Agree with Plan of Care  Patient       Patient will benefit from skilled therapeutic intervention in order to improve the following deficits and impairments:  Increased muscle spasms, Decreased skin integrity, Impaired tone, Decreased strength, Decreased coordination, Pain  Visit Diagnosis: Cramp and spasm  Unspecified lack of coordination     Problem List Patient Active Problem List   Diagnosis Date Noted  . CAD, multiple vessel 02/25/2017  . Exertional chest pain 12/24/2016  . Bradycardia 12/24/2016  . Contusion   . Syncope 09/25/2015  . Normocytic anemia 09/25/2015  . Occipital scalp laceration 09/25/2015  . Sinus bradycardia 07/16/2014  . Dyslipidemia 08/01/2013  . Elevated coronary artery calcium score 08/01/2013    Zannie Cove, PT 09/29/2017, 11:01 AM  [facility] [address], [place], [place], [address] Phone: [phone]   Fax:  [phone]  Name: Vraj Denardo MRN: [id number] Date of Birth: [birth year]

## 2017-11-17 ENCOUNTER — Ambulatory Visit: Payer: Managed Care, Other (non HMO) | Attending: Gastroenterology | Admitting: Physical Therapy

## 2017-11-17 DIAGNOSIS — R279 Unspecified lack of coordination: Secondary | ICD-10-CM | POA: Diagnosis present

## 2017-11-17 DIAGNOSIS — R252 Cramp and spasm: Secondary | ICD-10-CM | POA: Insufficient documentation

## 2017-11-17 NOTE — Therapy (Signed)
[facility] [address], [place], [place], [address] Phone: [phone]   Fax:  [phone]  Physical Therapy Treatment  Patient Details  Name: Connor Chung MRN: [id number] Date of Birth: 08/10/1955 Referring Provider: Mauri Pole   Encounter Date: 11/17/2017  PT End of Session - 11/17/17 1516    Visit Number  5    Date for PT Re-Evaluation  01/12/18    PT Start Time  7353    PT Stop Time  1320    PT Time Calculation (min)  45 min    Activity Tolerance  Patient tolerated treatment well    Behavior During Therapy  [facility] for tasks assessed/performed       Past Medical History:  Diagnosis Date  . Allergic rhinitis   . Hyperlipidemia     Past Surgical History:  Procedure Laterality Date  . 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 CYST REMOVAL NECK  2006   right clavicale  . TRANSTHORACIC ECHOCARDIOGRAM  08/2011   EF=>55%, borderline LA enlargement, RA mildly dilated, mild MR, trace TR, borderline aortic root dilatation   . WISDOM TOOTH EXTRACTION      There were no vitals filed for this visit.  Subjective Assessment - 11/17/17 1241    Subjective  Pt has been using the toileting techniques                    Pelvic Floor Special Questions - 11/17/17 0001    Tone  74mV resting, max contraction 63mV only sustained for 1-2 sec; unable to contract abdominals and pelvic floor, resting tone 11-28mV when not in side lying    Biofeedback  contract and hold 3-5 sec, relax and breathing in various positions    Biofeedback sensor type  Surface                  PT Short Term Goals - 09/12/17 1111      PT SHORT TERM GOAL #1   Title  ind with initial HEP    Time  4    Period  Weeks    Status  Achieved      PT SHORT TERM GOAL #2   Title  pt will be able to use toilet techniques and breathing correctly for less straining during BM    Status  Achieved        PT Long  Term Goals - 11/17/17 1517      PT LONG TERM GOAL #1   Title  pt will be ind with advanced HEP    Status  On-going    Target Date  01/12/18      PT LONG TERM GOAL #2   Title  Pt will report no leakage or smearing for one full week    Time  8    Period  Weeks    Status  Revised    Target Date  01/12/18      PT LONG TERM GOAL #3   Title  Pt will be able to perform controlled contraction and release of pelvic floor muscles as demonstrated with biofeedback for improved ability to manage bowels    Time  8    Period  Weeks    Status  New    Target Date  01/12/18      PT LONG TERM GOAL #4   Title  Pt will be able to coordinate abdominal along with pelvic floor for improved bowel control  and ability to perform functional tasks    Time  8    Period  Weeks    Status  New    Target Date  01/12/18            Plan - 11/17/17 1529    Clinical Impression Statement  Patient presents to PT for re-eval today due to continued leakage of mucus type of secretions.  Pt has difficulty controlling tone with increased tone in hooklying, sitting, and standing.  He demonstrates abilty to control tone in sidelying    Clinical Presentation  Stable    Clinical Presentation due to:  pt is stable    Clinical Decision Making  Moderate    Rehab Potential  Good    PT Frequency  1x / week    PT Duration  8 weeks    PT Treatment/Interventions  ADLs/Self Care Home Management;Biofeedback;Cryotherapy;Electrical Stimulation;Moist Heat;Functional mobility training;Therapeutic activities;Therapeutic exercise;Neuromuscular re-education;Patient/family education;Manual techniques;Taping;Dry needling    PT Next Visit Plan  biofeedback to work on muscle control, abdominal contraction with and without pelvic floor contraction    PT Home Exercise Plan  Access Code: [email or web address]    Consulted and Agree with Plan of Care  Patient       Patient will benefit from skilled therapeutic intervention in order to improve the  following deficits and impairments:  Increased muscle spasms, Decreased skin integrity, Impaired tone, Decreased strength, Decreased coordination, Pain  Visit Diagnosis: Cramp and spasm  Unspecified lack of coordination     Problem List Patient Active Problem List   Diagnosis Date Noted  . CAD, multiple vessel 02/25/2017  . Exertional chest pain 12/24/2016  . Bradycardia 12/24/2016  . Contusion   . Syncope 09/25/2015  . Normocytic anemia 09/25/2015  . Occipital scalp laceration 09/25/2015  . Sinus bradycardia 07/16/2014  . Dyslipidemia 08/01/2013  . Elevated coronary artery calcium score 08/01/2013    Zannie Cove, PT 11/17/2017, 3:36 PM  [facility] [address], [place], [place], [address] Phone: [phone]   Fax:  [phone]  Name: Connor Chung MRN: [id number] Date of Birth: 1956-01-04

## 2017-12-08 ENCOUNTER — Encounter: Payer: Managed Care, Other (non HMO) | Admitting: Physical Therapy

## 2017-12-14 ENCOUNTER — Ambulatory Visit: Payer: Managed Care, Other (non HMO) | Attending: Gastroenterology | Admitting: Physical Therapy

## 2017-12-14 DIAGNOSIS — R252 Cramp and spasm: Secondary | ICD-10-CM | POA: Insufficient documentation

## 2017-12-14 DIAGNOSIS — R279 Unspecified lack of coordination: Secondary | ICD-10-CM | POA: Diagnosis present

## 2017-12-14 NOTE — Therapy (Addendum)
[facility] [address], [place], [place], [address] Phone: [phone]   Fax:  [phone]  Physical Therapy Treatment  Patient Details  Name: Connor Chung MRN: [id number] Date of Birth: 1956-02-03 Referring Provider: Mauri Pole   Encounter Date: 12/14/2017  PT End of Session - 12/14/17 1022    Visit Number  6    Date for PT Re-Evaluation  01/12/18    PT Start Time  1020    PT Stop Time  1100    PT Time Calculation (min)  40 min    Activity Tolerance  Patient tolerated treatment well    Behavior During Therapy  [facility] for tasks assessed/performed       Past Medical History:  Diagnosis Date  . Allergic rhinitis   . Hyperlipidemia     Past Surgical History:  Procedure Laterality Date  . 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 CYST REMOVAL NECK  2006   right clavicale  . TRANSTHORACIC ECHOCARDIOGRAM  08/2011   EF=>55%, borderline LA enlargement, RA mildly dilated, mild MR, trace TR, borderline aortic root dilatation   . WISDOM TOOTH EXTRACTION      There were no vitals filed for this visit.  Subjective Assessment - 12/14/17 1024    Subjective  Pt has been doing well without leakage, but he had some leakage last week.  He states he had leakage 2 days and seemed to be related to looser stool.    Patient Stated Goals  get rid of leakage;     Currently in Pain?  No/denies                       [facility] Adult PT Treatment/Exercise - 12/14/17 0001      Neuro Re-ed    Neuro Re-ed Details   biofeedback      Exercises   Exercises  Lumbar      Lumbar Exercises: Supine   Other Supine Lumbar Exercises  graduated contract and relax; contract and hold 50% 10 sec x 10, 20 sec x 5; bridge with kegel and TrA contract hold 20 sec 2 x 5      TrA engaged with all exercises         PT Short Term Goals - 09/12/17 1111      PT SHORT TERM GOAL #1   Title  ind with initial HEP    Time  4    Period  Weeks    Status  Achieved      PT SHORT TERM GOAL #2   Title  pt will be able to use toilet techniques and breathing correctly for less straining during BM    Status  Achieved        PT Long Term Goals - 12/14/17 1317      PT LONG TERM GOAL #1   Title  pt will be ind with advanced HEP    Status  On-going      PT LONG TERM GOAL #2   Title  Pt will report no leakage or smearing for one full week    Baseline  leakage 2x in 2 weeks    Status  Partially Met      PT LONG TERM GOAL #3   Baseline  able to demonstrate good control with biofeedback    Status  Achieved      PT LONG TERM GOAL #4   Title  Pt will be able  to coordinate abdominal along with pelvic floor for improved bowel control and ability to perform functional tasks    Status  Achieved            Plan - 12/14/17 1043    Clinical Impression Statement  Pt needs cues to engage TrA initially but was able to do on his own after several attempts.  Pt had difficulty with controlled graduated lowering of tone but improved throughout session with biofeedback.  Pt was able to hold contraction at 50% for up to 20 seconds.  He will benefit from skilled PT to progress strength and control of pelvic floor muscles    PT Treatment/Interventions  ADLs/Self Care Home Management;Biofeedback;Cryotherapy;Electrical Stimulation;Moist Heat;Functional mobility training;Therapeutic activities;Therapeutic exercise;Neuromuscular re-education;Patient/family education;Manual techniques;Taping;Dry needling    PT Next Visit Plan  biofeedback to work on muscle control, abdominal contraction with and without pelvic floor contraction    PT Home Exercise Plan  Access Code: [email or web address]    Consulted and Agree with Plan of Care  Patient       Patient will benefit from skilled therapeutic intervention in order to improve the following deficits and impairments:  Increased muscle spasms, Decreased skin integrity, Impaired tone, Decreased  strength, Decreased coordination, Pain  Visit Diagnosis: Cramp and spasm  Unspecified lack of coordination     Problem List Patient Active Problem List   Diagnosis Date Noted  . CAD, multiple vessel 02/25/2017  . Exertional chest pain 12/24/2016  . Bradycardia 12/24/2016  . Contusion   . Syncope 09/25/2015  . Normocytic anemia 09/25/2015  . Occipital scalp laceration 09/25/2015  . Sinus bradycardia 07/16/2014  . Dyslipidemia 08/01/2013  . Elevated coronary artery calcium score 08/01/2013    Zannie Cove, PT 12/14/2017, 1:25 PM  [facility] [address], [place], [place], [address] Phone: [phone]   Fax:  [phone]  Name: Connor Chung MRN: [id number] Date of Birth: 11-28-1955  PHYSICAL THERAPY DISCHARGE SUMMARY  Visits from Start of Care: 6  Current functional level related to goals / functional outcomes: See above goals   Remaining deficits: See above goals   Education / Equipment: See above goals Plan: Patient agrees to discharge.  Patient goals were partially met. Patient is being discharged due to not returning since the last visit.  ?????     Google, PT 01/18/18 3:46 PM

## 2017-12-16 ENCOUNTER — Ambulatory Visit (INDEPENDENT_AMBULATORY_CARE_PROVIDER_SITE_OTHER): Payer: Managed Care, Other (non HMO) | Admitting: Internal Medicine

## 2017-12-16 DIAGNOSIS — Z7189 Other specified counseling: Secondary | ICD-10-CM

## 2017-12-16 DIAGNOSIS — Z298 Encounter for other specified prophylactic measures: Secondary | ICD-10-CM

## 2017-12-16 DIAGNOSIS — Z9189 Other specified personal risk factors, not elsewhere classified: Secondary | ICD-10-CM | POA: Diagnosis not present

## 2017-12-16 DIAGNOSIS — Z7185 Encounter for immunization safety counseling: Secondary | ICD-10-CM

## 2017-12-16 DIAGNOSIS — Z23 Encounter for immunization: Secondary | ICD-10-CM | POA: Diagnosis not present

## 2017-12-16 DIAGNOSIS — Z789 Other specified health status: Secondary | ICD-10-CM

## 2017-12-16 MED ORDER — AZITHROMYCIN 500 MG PO TABS: 500.0000 mg | ORAL_TABLET | Freq: Every day | ORAL | 0 refills | Status: DC

## 2017-12-16 MED ORDER — ATOVAQUONE-PROGUANIL HCL 250-100 MG PO TABS
1.0000 | ORAL_TABLET | Freq: Every day | ORAL | 0 refills | Status: DC
Start: 2017-12-16 — End: 2018-04-20

## 2017-12-19 NOTE — Progress Notes (Signed)
Subjective:   Connor Chung is a 62 y.o. male who presents to the Infectious Disease clinic for travel consultation.  Countries of travel: [place], [place], [place] Areas in country: both urban and rural Accommodations: hotels Purpose of travel: vacation in [place] to a week of business travel Prior travel out of [place]: yes     Objective:   Medications: reviewed    Assessment:   No contraindications to travel. none   Plan:    pre travel vaccination = gave typhoid injection  Malaria prophylaxis = will be needed for [place] arm of itinerary. Gave malarone and mosquito bite prevention  Traveler's diarrhea = gave precautions and prescription for azithro to use if needed

## 2018-03-20 ENCOUNTER — Telehealth: Payer: Self-pay | Admitting: Gastroenterology

## 2018-03-20 NOTE — Telephone Encounter (Signed)
Pt needs to schedule banding. Pls call him. 

## 2018-03-20 NOTE — Telephone Encounter (Signed)
Pt scheduled for Hem Banding 04/20/18@3 :15pm, pt aware of appt.

## 2018-04-03 IMAGING — CT CT HEART MORP W/ CTA COR W/ SCORE W/ CA W/CM &/OR W/O CM
4 of 7 series · 8 of 20 positions shown, 9 images · IV contrast (APPLIED)
Comparison: None

CLINICAL DATA: Chest pain

EXAM:
Cardiac CTA
MEDICATIONS:
Sub lingual nitro. 4mg and lopressor 0mg
TECHNIQUE: The patient was scanned on a Siemens Force [REDACTED]ice scanner. Gantry
rotation speed was 250 msecs. Collimation was . 6 mm . A 100 kV
prospective scan was triggered in the ascending thoracic aorta at
140 HU's with 5% padding centered around 35-75% of the R-R interval.
Average HR during the scan was 52 bpm. The 3D data set was
interpreted on a dedicated work station using MPR, MIP and VRT
modes. A total of 80cc of contrast was used.

[Series 6: best diast 68 % · axial · 0.32mm/px · z∈[+1132,+1199]mm · 2 of 498 slices shown, 3 images]
[im 166/498  vessel]
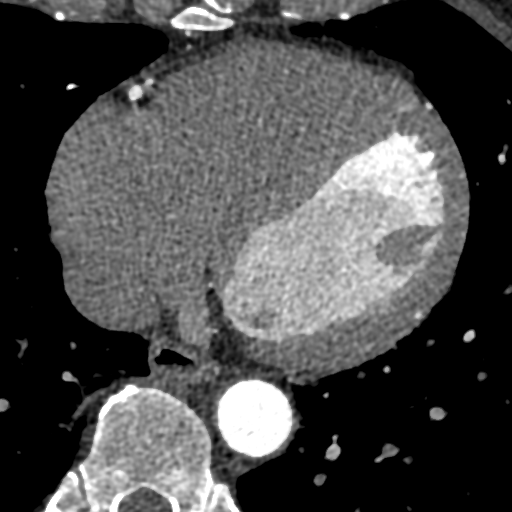
[im 166/498  lung]
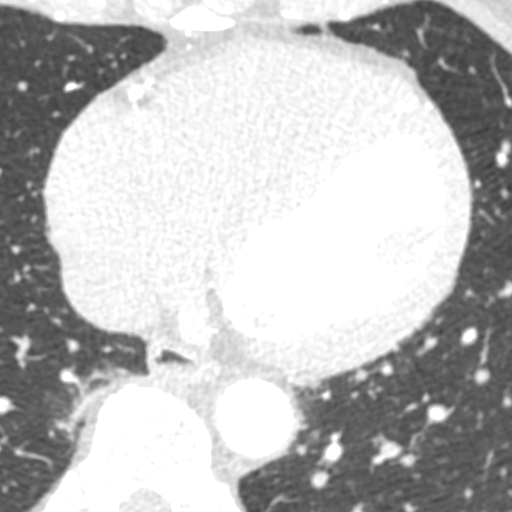
[im 332/498  vessel]
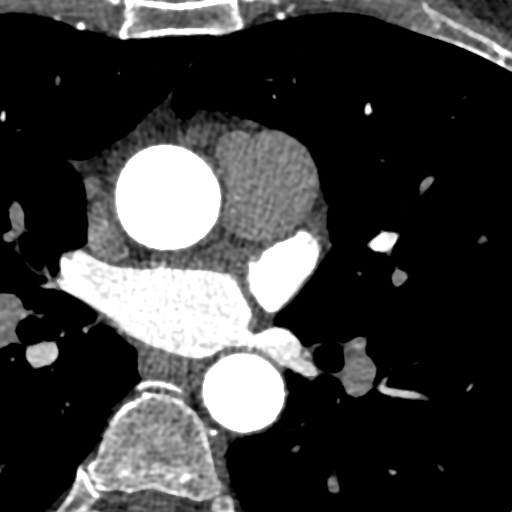

[Series 7: best syst 40 % · axial · 0.32mm/px · z∈[+1132,+1199]mm · 2 of 498 slices shown]
[im 166/498  vessel]
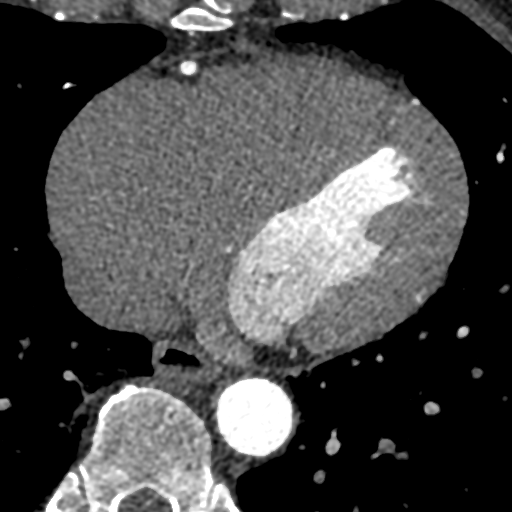
[im 332/498  vessel]
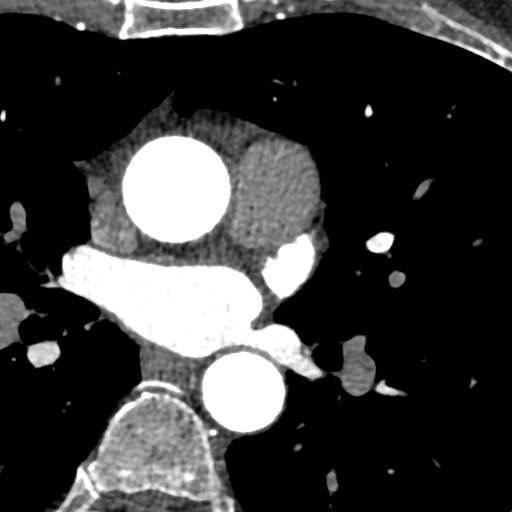

[Series 8: ts diast sharp 68 % · axial · 0.32mm/px · z∈[+1132,+1199]mm · 2 of 498 slices shown]
[im 166/498  lung]
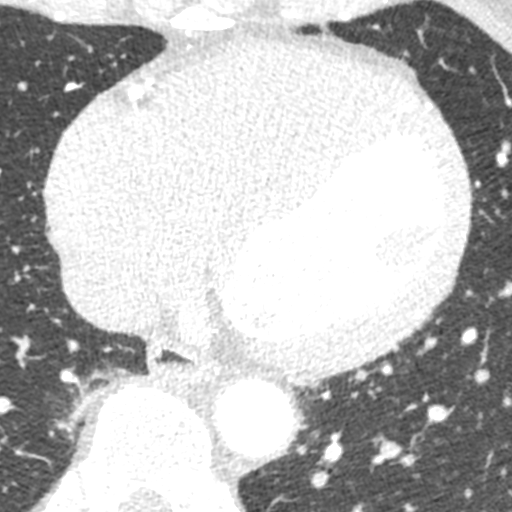
[im 332/498  lung]
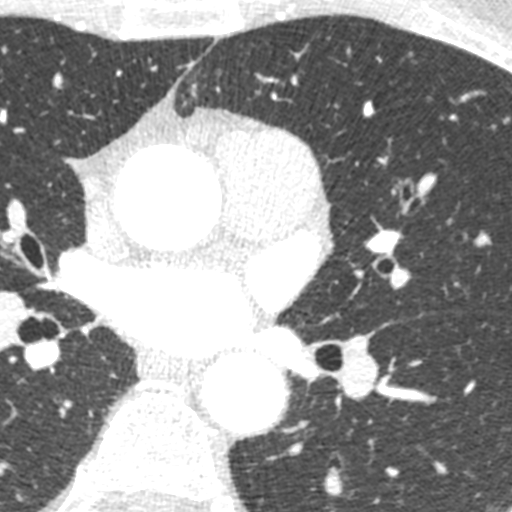

[Series 9: ts syst sharp 40 % · axial · 0.32mm/px · z∈[+1132,+1199]mm · 2 of 498 slices shown]
[im 166/498  lung]
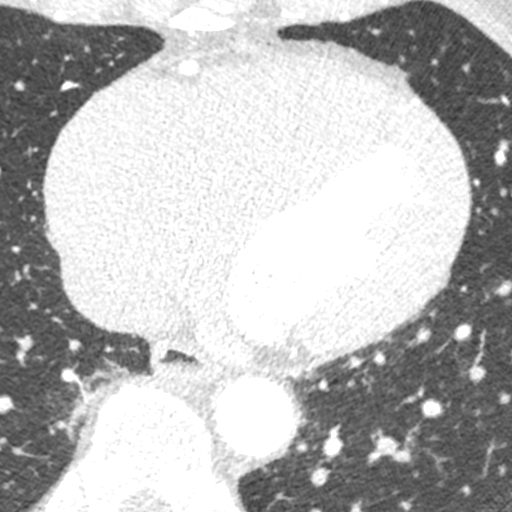
[im 332/498  lung]
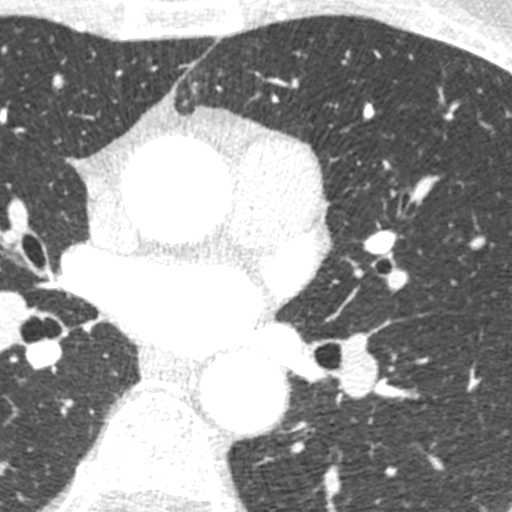

[8 of 20 positions shown; findings below may reference images not displayed]

FINDINGS: Non-cardiac: See separate report from [REDACTED]. No
significant findings on limited lung and soft tissue windows.

Calcium Score: 210 with calcium seen in all three coronary vessels
and LM

Normal aortic root 3.6 cm

Coronary Arteries: Right dominant with no anomalies

LM:  Less than 30% calcified distal stenosis

LAD:  Less than 30% calcified proximal stenosis

D1:  Normal

D2:  Small and normal

Circumflex:  Less than 30% proximal and mid  calcified stenosis

OM1:  Normal

AV Groove:  Normal

RCA: Less than 50% calcified stenosis in the proximal mid and distal
vessel

PDA:  Normal

PLA:  Normal
IMPRESSION: 1) Calcium score 210 which is 82nd percentile for age and sex
matched controls

2) Non obstructive CAD see description above right dominant
circulation

3) Normal aortic root 3.6 cm

Lenore Ly

EXAM:
OVER-READ INTERPRETATION  CT CHEST

The following report is an over-read performed by radiologist Dr.
Bieha Wam [REDACTED] on 01/11/2017. This over-read
does not include interpretation of cardiac or coronary anatomy or
pathology. The coronary CTA Interpretation by the cardiologist is
attached.
FINDINGS: Cardiovascular: Heart is borderline in size. Visualized aorta normal
caliber.

Mediastinum/Nodes: No mediastinal, hilar, or axillary adenopathy.

Lungs/Pleura: Scarring in the right lower lobe. Lungs otherwise
clear. No effusions.

Upper Abdomen: Imaging into the upper abdomen shows no acute
findings.

Musculoskeletal: Chest wall soft tissues are unremarkable. No acute
bony abnormality.
IMPRESSION: No acute or significant extracardiac abnormality.

## 2018-04-20 ENCOUNTER — Ambulatory Visit: Payer: Managed Care, Other (non HMO) | Admitting: Gastroenterology

## 2018-04-20 ENCOUNTER — Other Ambulatory Visit: Payer: Managed Care, Other (non HMO)

## 2018-04-20 ENCOUNTER — Encounter: Payer: Self-pay | Admitting: Gastroenterology

## 2018-04-20 VITALS — BP 90/58 | HR 67 | Ht 73.0 in | Wt 182.0 lb

## 2018-04-20 DIAGNOSIS — R197 Diarrhea, unspecified: Secondary | ICD-10-CM | POA: Diagnosis not present

## 2018-04-20 DIAGNOSIS — K641 Second degree hemorrhoids: Secondary | ICD-10-CM | POA: Diagnosis not present

## 2018-04-20 NOTE — Patient Instructions (Signed)

## 2018-04-20 NOTE — Progress Notes (Signed)
PROCEDURE NOTE: The patient presents with symptomatic grade II  hemorrhoids, requesting rubber band ligation of his/her hemorrhoidal disease.  All risks, benefits and alternative forms of therapy were described and informed consent was obtained.  In the Left Lateral Decubitus position anoscopic examination revealed grade II hemorrhoids in the Right anterior position(s).  The anorectum was pre-medicated with 0.125% Nitroglycerine and Recticare The decision was made to band the right anterior internal hemorrhoid, and the [place] was used to perform band ligation without complication.  Digital anorectal examination was then performed to assure proper positioning of the band, and to adjust the banded tissue as required.  The patient was discharged home without pain or other issues.  Dietary and behavioral recommendations were given and along with follow-up instructions.     The following adjunctive treatments were recommended: Benefiber or Fiberchoice 1 tab TID  The patient will return for  follow-up and possible additional banding as required. No complications were encountered and the patient tolerated the procedure well.  Damaris Hippo , MD [phone]

## 2018-04-25 ENCOUNTER — Other Ambulatory Visit: Payer: Managed Care, Other (non HMO)

## 2018-04-25 DIAGNOSIS — R197 Diarrhea, unspecified: Secondary | ICD-10-CM

## 2018-04-30 LAB — OVA AND PARASITE EXAMINATION
CONCENTRATE RESULT: NONE SEEN
SPECIMEN QUALITY:: ADEQUATE
TRICHROME RESULT: NONE SEEN
VKL: 91556498

## 2018-05-29 ENCOUNTER — Encounter: Payer: Self-pay | Admitting: Gastroenterology

## 2018-05-29 ENCOUNTER — Ambulatory Visit: Payer: Managed Care, Other (non HMO) | Admitting: Gastroenterology

## 2018-05-29 VITALS — BP 94/56 | HR 68 | Ht 71.75 in | Wt 179.0 lb

## 2018-05-29 DIAGNOSIS — K640 First degree hemorrhoids: Secondary | ICD-10-CM | POA: Diagnosis not present

## 2018-05-29 DIAGNOSIS — R159 Full incontinence of feces: Secondary | ICD-10-CM | POA: Diagnosis not present

## 2018-05-29 DIAGNOSIS — L290 Pruritus ani: Secondary | ICD-10-CM

## 2018-05-29 NOTE — Progress Notes (Signed)
Connor Chung    [id number]    1956/04/19  Primary Care Physician:Perini, Elta Guadeloupe, MD  Referring Physician: Crist Infante, [address] [place], [place] [address]  Chief complaint: Fecal seepage  HPI:  63 year old male with history of symptomatic hemorrhoids and fecal seepage here for follow-up visit.  Hemorrhoidal band ligationX4.  No longer has swelling, tissue protrusion or rectal bleeding.  He underwent pelvic floor physical therapy with no episodes of fecal incontinence but continues to feel moisture in the perianal area especially 15 to 20 minutes after he has a bowel movement.  He has to wipe himself again.  He does not have any other episodes throughout the day if he does not have further bowel movements.  Denies any change with exercise.  Has intermittent itching.  Stool ova and parasite negative. He is taking Fiberchoice 1 tablet twice daily.  Colonoscopy November 2016 with sigmoid diverticulosis and internal hemorrhoids, due for recall November 2026.  Denies any nausea, vomiting, abdominal pain, melena or bright red blood per rectum    Outpatient Encounter Medications as of 05/29/2018  Medication Sig  . cholecalciferol (VITAMIN D) 1000 UNITS tablet Take 2,000 Units by mouth daily.  . fluticasone (FLONASE) 50 MCG/ACT nasal spray Place 2 sprays into both nostrils daily.  . Omega-3 Fatty Acids (FISH OIL) 1200 MG CAPS Take 1 capsule by mouth daily.  Marland Kitchen REPATHA 140 MG/ML SOSY Inject 140 mg into the skin every 21 ( twenty-one) days.  . simvastatin (ZOCOR) 40 MG tablet Take 40 mg by mouth daily.  . vitamin B-12 (CYANOCOBALAMIN) 100 MCG tablet Take 100 mcg by mouth daily.   No facility-administered encounter medications on file as of 05/29/2018.     Allergies as of 05/29/2018  . (No Known Allergies)    Past Medical History:  Diagnosis Date  . Allergic rhinitis   . Hyperlipidemia     Past Surgical History:  Procedure Laterality Date  . 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 CYST REMOVAL NECK  2006   right clavicale  . TRANSTHORACIC ECHOCARDIOGRAM  08/2011   EF=>55%, borderline LA enlargement, RA mildly dilated, mild MR, trace TR, borderline aortic root dilatation   . WISDOM TOOTH EXTRACTION      Family History  Problem Relation Age of Onset  . Cancer Mother        skin   . Cancer Father        skin   . Heart Problems Maternal Grandmother   . Colon cancer Neg Hx   . Breast cancer Neg Hx     Social History   Socioeconomic History  . Marital status: Married    Spouse name: Not on file  . Number of children: 2  . Years of education: 61  . Highest education level: Not on file  Occupational History  . Occupation: 2  Social Needs  . Financial resource strain: Not on file  . Food insecurity:    Worry: Not on file    Inability: Not on file  . Transportation needs:    Medical: Not on file    Non-medical: Not on file  Tobacco Use  . Smoking status: Former Smoker    Last attempt to quit: 07/19/1988    Years since quitting: 29.8  . Smokeless tobacco: Never Used  Substance and Sexual Activity  . Alcohol use: Yes    Alcohol/week: 14.0 standard drinks    Types: 14 Cans of  beer per week  . Drug use: No  . Sexual activity: Not on file  Lifestyle  . Physical activity:    Days per week: Not on file    Minutes per session: Not on file  . Stress: Not on file  Relationships  . Social connections:    Talks on phone: Not on file    Gets together: Not on file    Attends religious service: Not on file    Active member of club or organization: Not on file    Attends meetings of clubs or organizations: Not on file    Relationship status: Not on file  . Intimate partner violence:    Fear of current or ex partner: Not on file    Emotionally abused: Not on file    Physically abused: Not on file    Forced sexual activity: Not on file  Other Topics Concern  . Not on file  Social History Narrative  . Not on file       Review of systems: Review of Systems  Constitutional: Negative for fever and chills.  HENT: Negative.   Eyes: Negative for blurred vision.  Respiratory: Negative for cough, shortness of breath and wheezing.   Cardiovascular: Negative for chest pain and palpitations.  Gastrointestinal: as per HPI Genitourinary: Negative for dysuria, urgency, frequency and hematuria.  Positive for enlarged prostate. Musculoskeletal: Negative for myalgias, back pain and joint pain.  Skin: Negative for itching and rash.  Neurological: Negative for dizziness, tremors, focal weakness, seizures and loss of consciousness.  Endo/Heme/Allergies: Negative for seasonal allergies.  Psychiatric/Behavioral: Negative for depression, suicidal ideas and hallucinations.  All other systems reviewed and are negative.   Physical Exam: Vitals:   05/29/18 1437  BP: (!) 94/56  Pulse: 68   Body mass index is 24.45 kg/m. Gen:      No acute distress HEENT:  EOMI, sclera anicteric Neck:     No masses; no thyromegaly Lungs:    Clear to auscultation bilaterally; normal respiratory effort CV:         Regular rate and rhythm; no murmurs Abd:      + bowel sounds; soft, non-tender; no palpable masses, no distension Ext:    No edema; adequate peripheral perfusion Skin:      Warm and dry; no rash Neuro: alert and oriented x 3 Psych: normal mood and affect Rectal exam: Normal anal sphincter tone, no anal fissure or external hemorrhoids Anoscopy: Small grade 1 internal hemorrhoids, no active bleeding, normal dentate line, no visible nodules  Data Reviewed:  Reviewed labs, radiology imaging, old records and pertinent past GI work up   Assessment and Plan/Recommendations:  63 year old male with history of symptomatic hemorrhoids status post hemorrhoidal band ligation. Pelvic floor physical therapy with improvement of anal sphincter tone Persistent moisture in the perianal area Advised patient to avoid excessive  hygiene or wiping multiple times after bowel movement Use moisture wicking underwear We will increase Fiberchoice to 1 tablet 3 times daily Continue pelvic floor exercises Avoid excessive straining with bowel movement Return as needed  25 minutes was spent face-to-face with the patient. Greater than 50% of the time used for counseling as well as treatment plan and follow-up.  He had multiple questions which were answered to his satisfaction  K. Denzil Magnuson , MD [phone]    CC: Crist Infante, MD

## 2018-05-29 NOTE — Patient Instructions (Addendum)
Increase fiberchoice to three times a day  Continue Pelvic floor exercises   Follow up as needed    If you are age 61 or older, your body mass index should be between 23-30. Your Body mass index is 24.45 kg/m. If this is out of the aforementioned range listed, please consider follow up with your Primary Care Provider.  If you are age 84 or younger, your body mass index should be between 19-25. Your Body mass index is 24.45 kg/m. If this is out of the aformentioned range listed, please consider follow up with your Primary Care Provider.    I appreciate the  opportunity to care for you  Thank You   Harl Bowie , MD

## 2018-05-30 ENCOUNTER — Encounter: Payer: Self-pay | Admitting: Gastroenterology

## 2018-07-17 ENCOUNTER — Telehealth: Payer: Self-pay | Admitting: Gastroenterology

## 2018-07-17 NOTE — Telephone Encounter (Signed)
We can refer him to [facility] to check if they can help his issue, fecal seepage after a bowel movement. Thanks

## 2018-07-17 NOTE — Telephone Encounter (Signed)
Pt called said he is happy with Dr. Silverio Decamp but would like to know if their is maybe any other docotor recommendations she could send him to for his current problem.

## 2018-07-17 NOTE — Telephone Encounter (Signed)
Patient states agreement with this plan. Referral to the [facility] GI department.

## 2018-08-02 ENCOUNTER — Telehealth: Payer: Self-pay | Admitting: Gastroenterology

## 2018-08-02 NOTE — Telephone Encounter (Signed)
Pt called to let you know that he has not heard from [facility] yet.

## 2018-08-03 NOTE — Telephone Encounter (Signed)
Records faxed again to [facility]. Confirmation obtained.

## 2018-08-09 ENCOUNTER — Telehealth: Payer: Self-pay | Admitting: Gastroenterology

## 2018-08-15 NOTE — Telephone Encounter (Signed)
Follow up call to [facility]. They will move the referral to the GI clinic.

## 2018-08-15 NOTE — Telephone Encounter (Signed)
Follow up call to [facility]. They will move the referral to the GI clinic

## 2018-08-17 ENCOUNTER — Other Ambulatory Visit: Payer: Self-pay

## 2018-08-17 DIAGNOSIS — R159 Full incontinence of feces: Secondary | ICD-10-CM

## 2018-08-17 NOTE — Telephone Encounter (Signed)
Tried sending an internal referral.

## 2018-08-21 ENCOUNTER — Telehealth: Payer: Self-pay | Admitting: Gastroenterology

## 2018-08-21 NOTE — Telephone Encounter (Signed)
Spoke with [facility]. The patient will be contacted today to schedule. Spoke with the patient. He is satisfied with this.

## 2018-09-01 ENCOUNTER — Other Ambulatory Visit: Payer: Self-pay | Admitting: Internal Medicine

## 2018-09-01 ENCOUNTER — Other Ambulatory Visit: Payer: Self-pay

## 2018-09-01 ENCOUNTER — Ambulatory Visit
Admission: RE | Admit: 2018-09-01 | Discharge: 2018-09-01 | Disposition: A | Discharge status: Home / Self Care | Payer: Managed Care, Other (non HMO) | Source: Ambulatory Visit | Attending: Internal Medicine | Admitting: Internal Medicine

## 2018-09-01 DIAGNOSIS — R10814 Left lower quadrant abdominal tenderness: Secondary | ICD-10-CM

## 2018-09-01 MED ORDER — IOPAMIDOL (ISOVUE-300) INJECTION 61%
100.0000 mL | Freq: Once | INTRAVENOUS | Status: AC | PRN
  Administered 2018-09-01: 100 mL via INTRAVENOUS

## 2018-11-14 ENCOUNTER — Telehealth: Payer: Self-pay

## 2018-11-14 NOTE — Telephone Encounter (Signed)
Patient reports he has had some events occur that he would like to share. He was treated for diverticulitis by his PCP twice. Both times he was on the antibiotics his anal leakage would stop. His wife pointed out that he was also abstaining from alcohol while on antibiotics. He is "experimenting" with this theory and has moved his referral appointment out further.  Any thoughts on this?

## 2018-11-15 NOTE — Telephone Encounter (Signed)
Alcohol can increase bowel frequency and urgency, agree with abstaining from ETOH and see if symptoms improve. Thanks

## 2018-11-15 NOTE — Telephone Encounter (Signed)
No answer. Left this information on his voicemail.

## 2019-02-01 ENCOUNTER — Other Ambulatory Visit: Payer: Self-pay | Admitting: Internal Medicine

## 2019-02-01 DIAGNOSIS — N6320 Unspecified lump in the left breast, unspecified quadrant: Secondary | ICD-10-CM

## 2019-02-02 ENCOUNTER — Ambulatory Visit
Admission: RE | Admit: 2019-02-02 | Discharge: 2019-02-02 | Disposition: A | Discharge status: Home / Self Care | Payer: Managed Care, Other (non HMO) | Source: Ambulatory Visit | Attending: Internal Medicine | Admitting: Internal Medicine

## 2019-02-02 ENCOUNTER — Other Ambulatory Visit: Payer: Self-pay

## 2019-02-02 DIAGNOSIS — N6320 Unspecified lump in the left breast, unspecified quadrant: Secondary | ICD-10-CM

## 2020-07-03 ENCOUNTER — Ambulatory Visit (INDEPENDENT_AMBULATORY_CARE_PROVIDER_SITE_OTHER): Payer: Managed Care, Other (non HMO)

## 2020-07-03 ENCOUNTER — Other Ambulatory Visit: Payer: Self-pay

## 2020-07-03 ENCOUNTER — Ambulatory Visit: Payer: Managed Care, Other (non HMO) | Admitting: Podiatry

## 2020-07-03 ENCOUNTER — Encounter: Payer: Self-pay | Admitting: Podiatry

## 2020-07-03 DIAGNOSIS — D169 Benign neoplasm of bone and articular cartilage, unspecified: Secondary | ICD-10-CM

## 2020-07-03 DIAGNOSIS — M2041 Other hammer toe(s) (acquired), right foot: Secondary | ICD-10-CM | POA: Diagnosis not present

## 2020-07-03 DIAGNOSIS — M79674 Pain in right toe(s): Secondary | ICD-10-CM

## 2020-07-03 DIAGNOSIS — L84 Corns and callosities: Secondary | ICD-10-CM | POA: Diagnosis not present

## 2020-07-03 NOTE — Progress Notes (Signed)
Subjective:   Patient ID: Connor Chung, male   DOB: 65 y.o.   MRN: [id number]   HPI Patient presents with a very painful lesion on the inside of the right fifth toe stating he had worked on it previously developed an infection and ended up being on antibiotics.  States it remains sore its been present now for at least 2 months and it is hard for him to wear shoe gear and he is not sure if it is the nail or the skin.  Patient does not smoke likes to be active   Review of Systems  All other systems reviewed and are negative.       Objective:  Physical Exam Vitals and nursing note reviewed.  Constitutional:      Appearance: He is well-developed.  Pulmonary:     Effort: Pulmonary effort is normal.  Musculoskeletal:        General: Normal range of motion.  Skin:    General: Skin is warm.  Neurological:     Mental Status: He is alert.     Neurovascular status intact muscle strength adequate range of motion adequate with rotated fifth digit right with a keratotic lesion on the inside of the fifth toe which is sore when pressed with moderate deformity also of the nailbed but not to the same degree.  Patient is found to have good digital perfusion well oriented x3 with no other pathology     Assessment:  Significant rotation digit 5 right with distal medial keratotic lesion painful when palpated     Plan:  H&P x-ray reviewed condition discussed.  At this point I anesthetized the right fifth digit and after appropriate numbness using sterile sharp instrumentation I debrided the lesion applied padding and educated him on the possibility for derotational arthroplasty exostectomy or just exostectomy.  We will see results and decide what else may be appropriate  X-rays indicate significant rotation digit 5 right with pressure between the fourth and fifth toes

## 2020-08-21 ENCOUNTER — Encounter: Payer: Self-pay | Admitting: Gastroenterology

## 2020-08-21 ENCOUNTER — Ambulatory Visit: Payer: Managed Care, Other (non HMO) | Admitting: Gastroenterology

## 2020-08-21 VITALS — BP 100/50 | HR 64 | Ht 71.75 in | Wt 177.4 lb

## 2020-08-21 DIAGNOSIS — R195 Other fecal abnormalities: Secondary | ICD-10-CM

## 2020-08-21 DIAGNOSIS — R1032 Left lower quadrant pain: Secondary | ICD-10-CM | POA: Diagnosis not present

## 2020-08-21 MED ORDER — ONDANSETRON HCL 4 MG PO TABS: ORAL_TABLET | ORAL | 0 refills | Status: DC

## 2020-08-21 MED ORDER — SUTAB 1479-225-188 MG PO TABS: 24.0000 | ORAL_TABLET | ORAL | 0 refills | Status: DC

## 2020-08-21 NOTE — Progress Notes (Signed)
Connor Chung    [id number]    02/22/1956  Primary Care Physician:Perini, Elta Guadeloupe, MD  Referring Physician: Crist Infante, [place],  [place] [address]   Chief complaint: Positive fecal hemeoccult  HPI:  65 year old very pleasant gentleman here for evaluation of positive fecal heme occult  Denies any overt melena or rectal bleeding He had an episode of left lower quadrant abdominal pain and was treated with course of antibiotics for presumed diverticulitis he continues to have mild discomfort in the left lower quadrant.  He is s/p hemorrhoidal band ligationX4.  No longer has swelling, tissue protrusion or rectal bleeding.    Colonoscopy November 2016 with sigmoid diverticulosis and internal hemorrhoids, due for recall November 2026.  Denies any nausea, vomiting, abdominal pain, or unintentional weight loss   Outpatient Encounter Medications as of 08/21/2020  Medication Sig  . cholecalciferol (VITAMIN D) 1000 UNITS tablet Take 2,000 Units by mouth daily.  . fluticasone (FLONASE) 50 MCG/ACT nasal spray Place 2 sprays into both nostrils daily.  . Omega-3 Fatty Acids (FISH OIL) 1200 MG CAPS Take 1 capsule by mouth daily.  Marland Kitchen REPATHA 140 MG/ML SOSY Inject 140 mg into the skin every 21 ( twenty-one) days.  . simvastatin (ZOCOR) 40 MG tablet Take 40 mg by mouth daily.  . vitamin B-12 (CYANOCOBALAMIN) 100 MCG tablet Take 100 mcg by mouth daily.   No facility-administered encounter medications on file as of 08/21/2020.    Allergies as of 08/21/2020  . (No Known Allergies)    Past Medical History:  Diagnosis Date  . Allergic rhinitis   . Hyperlipidemia     Past Surgical History:  Procedure Laterality Date  . 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 CYST REMOVAL NECK  2006   right clavicale  . TRANSTHORACIC ECHOCARDIOGRAM  08/2011   EF=>55%, borderline LA enlargement, RA mildly dilated, mild MR, trace TR, borderline aortic root dilatation    . WISDOM TOOTH EXTRACTION      Family History  Problem Relation Age of Onset  . Cancer Mother        skin   . Cancer Father        skin   . Heart Problems Maternal Grandmother   . Colon cancer Neg Hx   . Breast cancer Neg Hx     Social History   Socioeconomic History  . Marital status: Married    Spouse name: Not on file  . Number of children: 2  . Years of education: 27  . Highest education level: Not on file  Occupational History  . Occupation: 2  Tobacco Use  . Smoking status: Former Smoker    Quit date: 07/19/1988    Years since quitting: 32.1  . Smokeless tobacco: Never Used  Substance and Sexual Activity  . Alcohol use: Yes    Alcohol/week: 14.0 standard drinks    Types: 14 Cans of beer per week  . Drug use: No  . Sexual activity: Not on file  Other Topics Concern  . Not on file  Social History Narrative  . Not on file   Social Determinants of Health   Financial Resource Strain: Not on file  Food Insecurity: Not on file  Transportation Needs: Not on file  Physical Activity: Not on file  Stress: Not on file  Social Connections: Not on file  Intimate Partner Violence: Not on file      Review of systems: All  other review of systems negative except as mentioned in the HPI.   Physical Exam: Vitals:   08/21/20 1340  BP: (!) 100/50  Pulse: 64   Body mass index is 24.23 kg/m. Gen:      No acute distress HEENT:  sclera anicteric Abd:      soft, non-tender; no palpable masses, no distension Ext:    No edema Neuro: alert and oriented x 3 Psych: normal mood and affect  Data Reviewed:  Reviewed labs, radiology imaging, old records and pertinent past GI work up   Assessment and Plan/Recommendations:  65 year old very pleasant gentleman with positive Hemoccult card X3 Recent episode of severe left lower quadrant abdominal pain, was empirically treated for acute diverticulitis with improvement We will plan to proceed with colonoscopy for further  evaluation of positive Hemoccult and given recent episode of acute diverticulitis even though his last colonoscopy was in 2016 The risks and benefits as well as alternatives of endoscopic procedure(s) have been discussed and reviewed. All questions answered. The patient agrees to proceed.    Damaris Hippo , MD    CC: Crist Infante, MD

## 2020-08-21 NOTE — Patient Instructions (Addendum)
You have been scheduled for a colonoscopy. Please follow written instructions given to you at your visit today.  Please pick up your prep supplies at the pharmacy within the next 1-3 days. If you use inhalers (even only as needed), please bring them with you on the day of your procedure.  Will use Zofran 1 30 minutes before start of first dose of prep and 1 zofran before 2nd dose of prep   Due to recent changes in healthcare laws, you may see the results of your imaging and laboratory studies on MyChart before your provider has had a chance to review them.  We understand that in some cases there may be results that are confusing or concerning to you. Not all laboratory results come back in the same time frame and the provider may be waiting for multiple results in order to interpret others.  Please give [place] 48 hours in order for your provider to thoroughly review all the results before contacting the office for clarification of your results.   If you are age 31 or older, your body mass index should be between 23-30. Your Body mass index is 24.23 kg/m. If this is out of the aforementioned range listed, please consider follow up with your Primary Care Provider.  If you are age 42 or younger, your body mass index should be between 19-25. Your Body mass index is 24.23 kg/m. If this is out of the aformentioned range listed, please consider follow up with your Primary Care Provider.     I appreciate the  opportunity to care for you  Thank You   Harl Bowie , MD

## 2020-09-03 ENCOUNTER — Encounter: Payer: Self-pay | Admitting: Gastroenterology

## 2020-09-17 ENCOUNTER — Other Ambulatory Visit: Payer: Self-pay

## 2020-09-17 ENCOUNTER — Encounter: Payer: Self-pay | Admitting: Podiatry

## 2020-09-17 ENCOUNTER — Ambulatory Visit: Payer: Managed Care, Other (non HMO) | Admitting: Podiatry

## 2020-09-17 ENCOUNTER — Telehealth: Payer: Self-pay | Admitting: Urology

## 2020-09-17 DIAGNOSIS — D169 Benign neoplasm of bone and articular cartilage, unspecified: Secondary | ICD-10-CM | POA: Diagnosis not present

## 2020-09-17 NOTE — Telephone Encounter (Signed)
OFFICE DOS - 10/08/20  EXOSTECTOMY 5TH RIGHT --- 47998   [employer] EFFECTIVE DATE -   SPOKE WITH [employer]'S AUTOMATIVE SYSTEM FOR CPT CODE 72158 NO PRIOR AUTH IS REQUIRED.  REF # [id number]

## 2020-09-17 NOTE — Progress Notes (Signed)
Subjective:   Patient ID: Connor Chung, male   DOB: 65 y.o.   MRN: [id number]   HPI Patient presents stating this area on his fifth toe right has become aggravated again and he just had short-term relief from symptoms   ROS      Objective:  Physical Exam neurovascular status intact with patient's fifth digit right showing a exostotic keratotic lesion distal medial aspect painful when pressed      Assessment:  Exostotic lesion fifth digit right with pain     Plan:  H&P reviewed condition recommended surgical intervention due to quick return and discomfort he is experiencing and I explained procedure that can be done in the office with removal of wedge plus removal of the bone spur with surgical intervention.  He wants surgery and I allowed him to read consent form going over alternative treatments complications and patient scheduled for outpatient surgery.  Patient encouraged to call questions concerns and this will be done in the office environment and should take several months for complete recovery

## 2020-09-30 ENCOUNTER — Ambulatory Visit (AMBULATORY_SURGERY_CENTER): Payer: Managed Care, Other (non HMO) | Admitting: Gastroenterology

## 2020-09-30 ENCOUNTER — Encounter: Payer: Self-pay | Admitting: Gastroenterology

## 2020-09-30 ENCOUNTER — Other Ambulatory Visit: Payer: Self-pay

## 2020-09-30 VITALS — BP 109/60 | HR 47 | Temp 98.1°F | Resp 21 | Ht 71.75 in | Wt 177.0 lb

## 2020-09-30 DIAGNOSIS — K5730 Diverticulosis of large intestine without perforation or abscess without bleeding: Secondary | ICD-10-CM

## 2020-09-30 DIAGNOSIS — R195 Other fecal abnormalities: Secondary | ICD-10-CM

## 2020-09-30 DIAGNOSIS — R1032 Left lower quadrant pain: Secondary | ICD-10-CM

## 2020-09-30 HISTORY — PX: COLONOSCOPY: SHX174

## 2020-09-30 MED ORDER — SODIUM CHLORIDE 0.9 % IV SOLN: 500.0000 mL | Freq: Once | INTRAVENOUS | Status: DC

## 2020-09-30 NOTE — Patient Instructions (Signed)
Handout on diverticulosis given to you today  Repeat colonoscopy recommended in 2026  YOU HAD AN ENDOSCOPIC PROCEDURE TODAY AT [facility]:   Refer to the procedure report that was given to you for any specific questions about what was found during the examination.  If the procedure report does not answer your questions, please call your gastroenterologist to clarify.  If you requested that your care partner not be given the details of your procedure findings, then the procedure report has been included in a sealed envelope for you to review at your convenience later.  YOU SHOULD EXPECT: Some feelings of bloating in the abdomen. Passage of more gas than usual.  Walking can help get rid of the air that was put into your GI tract during the procedure and reduce the bloating. If you had a lower endoscopy (such as a colonoscopy or flexible sigmoidoscopy) you may notice spotting of blood in your stool or on the toilet paper. If you underwent a bowel prep for your procedure, you may not have a normal bowel movement for a few days.  Please Note:  You might notice some irritation and congestion in your nose or some drainage.  This is from the oxygen used during your procedure.  There is no need for concern and it should clear up in a day or so.  SYMPTOMS TO REPORT IMMEDIATELY:   Following lower endoscopy (colonoscopy or flexible sigmoidoscopy):  Excessive amounts of blood in the stool  Significant tenderness or worsening of abdominal pains  Swelling of the abdomen that is new, acute  Fever of 100F or higher  For urgent or emergent issues, a gastroenterologist can be reached at any hour by calling [phone]. Do not use MyChart messaging for urgent concerns.    DIET:  We do recommend a small meal at first, but then you may proceed to your regular diet.  Drink plenty of fluids but you should avoid alcoholic beverages for 24 hours.  ACTIVITY:  You should plan to take it easy for  the rest of today and you should NOT DRIVE or use heavy machinery until tomorrow (because of the sedation medicines used during the test).    FOLLOW UP: Our staff will call the number listed on your records 48-72 hours following your procedure to check on you and address any questions or concerns that you may have regarding the information given to you following your procedure. If we do not reach you, we will leave a message.  We will attempt to reach you two times.  During this call, we will ask if you have developed any symptoms of COVID 19. If you develop any symptoms (ie: fever, flu-like symptoms, shortness of breath, cough etc.) before then, please call [phone].  If you test positive for Covid 19 in the 2 weeks post procedure, please call and report this information to [place].    SIGNATURES/CONFIDENTIALITY: You and/or your care partner have signed paperwork which will be entered into your electronic medical record.  These signatures attest to the fact that that the information above on your After Visit Summary has been reviewed and is understood.  Full responsibility of the confidentiality of this discharge information lies with you and/or your care-partner.

## 2020-09-30 NOTE — Op Note (Signed)
[facility] Patient Name: Connor Chung Procedure Date: 09/30/2020 3:16 PM MRN: [id number] Endoscopist: Mauri Pole , MD Age: 65 Referring MD:  Date of Birth: Sep 19, 1955 Gender: Male Account #: [id number] Procedure:                Colonoscopy Indications:              Evaluation of unexplained GI bleeding presenting                            with fecal occult blood Medicines:                Monitored Anesthesia Care Procedure:                Pre-Anesthesia Assessment:                           - Prior to the procedure, a History and Physical                            was performed, and patient medications and                            allergies were reviewed. The patient's tolerance of                            previous anesthesia was also reviewed. The risks                            and benefits of the procedure and the sedation                            options and risks were discussed with the patient.                            All questions were answered, and informed consent                            was obtained. Prior Anticoagulants: The patient has                            taken no previous anticoagulant or antiplatelet                            agents. ASA Grade Assessment: II - A patient with                            mild systemic disease. After reviewing the risks                            and benefits, the patient was deemed in                            satisfactory condition to undergo the procedure.  After obtaining informed consent, the colonoscope                            was passed under direct vision. Throughout the                            procedure, the patient's blood pressure, pulse, and                            oxygen saturations were monitored continuously. The                            Olympus PCF-H190DL [id number]) Colonoscope was                            introduced through the anus and advanced  to the the                            cecum, identified by appendiceal orifice and                            ileocecal valve. The colonoscopy was technically                            difficult and complex due to poor bowel prep with                            stool present. The patient tolerated the procedure                            well. The quality of the bowel preparation was                            poor. The ileocecal valve, appendiceal orifice, and                            rectum were photographed. Scope In: 3:32:17 PM Scope Out: 3:52:35 PM Scope Withdrawal Time: 0 hours 6 minutes 55 seconds  Total Procedure Duration: 0 hours 20 minutes 18 seconds  Findings:                 The perianal and digital rectal examinations were                            normal.                           Scattered small and large-mouthed diverticula were                            found in the sigmoid colon and descending colon.                           A moderate amount of stool was found in the entire  colon, interfering with visualization. Lavage of                            the area was performed, resulting in incomplete                            clearance with fair visualization.                           A less than 5 mm scars were found in the rectum s/p                            hemorrhoidal band ligation. Complications:            No immediate complications. Estimated Blood Loss:     Estimated blood loss: none. Impression:               - Preparation of the colon was poor.                           - Diverticulosis in the sigmoid colon and in the                            descending colon.                           - Stool in the entire examined colon.                           - Scar in the rectum.                           - No specimens collected. Recommendation:           - Resume previous diet.                           - Continue present  medications.                           - Repeat colonoscopy in 2026 for screening purposes. Mauri Pole, MD 09/30/2020 3:57:51 PM This report has been signed electronically.

## 2020-09-30 NOTE — Progress Notes (Signed)
Pt's states no medical or surgical changes since previsit or office visit.  ° °Vitals CW °

## 2020-09-30 NOTE — Progress Notes (Signed)
pt tolerated well. VSS. awake and to recovery. Report given to RN.  

## 2020-10-02 ENCOUNTER — Telehealth: Payer: Self-pay | Admitting: *Deleted

## 2020-10-02 NOTE — Telephone Encounter (Signed)
  Follow up Call-  Call back number 09/30/2020  Post procedure Call Back phone  # [phone]  Permission to leave phone message Yes  Some recent data might be hidden     Patient questions:  Do you have a fever, pain , or abdominal swelling? No. Pain Score  0 *  Have you tolerated food without any problems? Yes.    Have you been able to return to your normal activities? Yes.    Do you have any questions about your discharge instructions: Diet   No. Medications  No. Follow up visit  No.  Do you have questions or concerns about your Care? No.  Actions: * If pain score is 4 or above: No action needed, pain <4.Have you developed a fever since your procedure? no  2.   Have you had an respiratory symptoms (SOB or cough) since your procedure? no  3.   Have you tested positive for COVID 19 since your procedure no  4.   Have you had any family members/close contacts diagnosed with the COVID 19 since your procedure?  no   If yes to any of these questions please route to Joylene Domingos, RN and Joella Prince, RN

## 2020-10-07 ENCOUNTER — Telehealth: Payer: Self-pay | Admitting: *Deleted

## 2020-10-07 NOTE — Telephone Encounter (Signed)
Patient is calling with questions about his surgery on 10/08/20. He wanted to know will he be able to drive himself to and from the surgery,how long will he have to wear the boot and surgical show. He is trying to make plans for recovery. Please advise.

## 2020-10-07 NOTE — Telephone Encounter (Signed)
He cancelled surgery. He is fine to drive when he has surgery

## 2020-10-08 ENCOUNTER — Ambulatory Visit: Payer: Managed Care, Other (non HMO) | Admitting: Podiatry

## 2020-10-08 NOTE — Telephone Encounter (Signed)
Returned the call to patient, no answer, left vmessage per Dr Mellody Drown approval to drive after surgery.

## 2020-10-14 NOTE — Telephone Encounter (Signed)
Follow up call made. 

## 2020-10-22 ENCOUNTER — Encounter: Payer: Managed Care, Other (non HMO) | Admitting: Podiatry

## 2021-12-18 ENCOUNTER — Ambulatory Visit: Payer: Self-pay | Admitting: Podiatry

## 2022-08-27 ENCOUNTER — Telehealth: Payer: Self-pay

## 2022-08-27 NOTE — Telephone Encounter (Signed)
Patient leaves a voicemail reporting he is feeling a nonpainful knot at the rectum when he cleans after a bowel movement.   Spoke with the patient. He describes the area as a hard knot the size of a BB or maybe a pea. He does not have pain or bleeding from this area. It was noticed a month ago. He has not noticed any change in size. Offered an appointment 09/01/22. The patient is out of town. He has a physical with his PCP in July. He is concerned about waiting that long to have this looked at. Please advise.

## 2022-09-01 ENCOUNTER — Ambulatory Visit: Payer: Managed Care, Other (non HMO) | Admitting: Gastroenterology

## 2022-09-01 NOTE — Telephone Encounter (Signed)
Patient no showed for his appointment this morning.

## 2022-09-02 NOTE — Telephone Encounter (Signed)
Ok, please schedule next available appt with me or APP soon if I get any cancellations. Thank you

## 2022-09-03 NOTE — Telephone Encounter (Signed)
Monitoring the schedule for a cancellation on any schedule. Tentatively scheduled for 11/03/22 at 3:30 pm.  Called to advise the patient. Had to leave a voicemail.

## 2022-09-06 NOTE — Telephone Encounter (Signed)
Patient called stated he is available to schedule please call him back.

## 2022-09-06 NOTE — Telephone Encounter (Signed)
Spoke with the patient. He accepts this appointment.

## 2022-09-14 ENCOUNTER — Encounter: Payer: Self-pay | Admitting: Physician Assistant

## 2022-09-14 ENCOUNTER — Ambulatory Visit: Payer: 59 | Admitting: Physician Assistant

## 2022-09-14 VITALS — BP 90/60 | HR 64 | Ht 71.75 in | Wt 171.4 lb

## 2022-09-14 DIAGNOSIS — K648 Other hemorrhoids: Secondary | ICD-10-CM

## 2022-09-14 DIAGNOSIS — Z86010 Personal history of colonic polyps: Secondary | ICD-10-CM

## 2022-09-14 NOTE — Progress Notes (Signed)
Subjective:    Patient ID: Connor Chung, male    DOB: 1956/01/14, 67 y.o.   MRN: [id number]  HPI  Connor Chung is a 67 year old white male, established with Dr. Lavon Paganini who comes in today with concern about a bump in his anorectal area.  He says he has noticed this over the past couple of months.  This area is not bothering him at all, no discomfort or tenderness, no bleeding, he has just been aware of it and wanted to be sure it was not anything to be concerned about.  He says the only time he feels this is with wiping after bowel movements. He was last seen in 2022 and had colonoscopy in June 2022 for heme positive stool.  He was noted to have scattered diverticulosis and scars from prior hemorrhoidal banding, and poor bowel prep.,  No polyps.  Per notes indicated for 4-year interval follow-up.  Colonoscopy in 2016 with sigmoid diverticulosis, small internal hemorrhoids and he had a 4 mm polyp removed from the sigmoid colon which was benign colonic mucosa.  Review of Systems Pertinent positive and negative review of systems were noted in the above HPI section.  All other review of systems was otherwise negative.   Outpatient Encounter Medications as of 09/14/2022  Medication Sig   amoxicillin-clavulanate (AUGMENTIN) 875-125 MG tablet Take 1 tablet by mouth 2 (two) times daily.   cholecalciferol (VITAMIN D) 1000 UNITS tablet Take 2,000 Units by mouth daily.   fluticasone (FLONASE) 50 MCG/ACT nasal spray Place 1 spray into both nostrils daily as needed.   hydrocortisone 2.5 % cream Apply 1 Application topically 2 (two) times daily.   Omega-3 Fatty Acids (FISH OIL) 1200 MG CAPS Take 1 capsule by mouth daily.   REPATHA 140 MG/ML SOSY Inject 140 mg into the skin every 14 (fourteen) days.   simvastatin (ZOCOR) 20 MG tablet Take 20 mg by mouth daily.   vitamin B-12 (CYANOCOBALAMIN) 100 MCG tablet Take 100 mcg by mouth daily.   [DISCONTINUED] simvastatin (ZOCOR) 40 MG tablet Take 40 mg by mouth daily.    [DISCONTINUED] Ascorbic Acid (VITAMIN C PO) Take 1 capsule by mouth daily.   [DISCONTINUED] ondansetron (ZOFRAN) 4 MG tablet Will use Zofran 1 30 minutes before start of first dose of colonoscopy prep and 1 zofran before 2nd dose of colonoscopy prep   No facility-administered encounter medications on file as of 09/14/2022.   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   Exertional chest pain 12/24/2016   Bradycardia 12/24/2016   Contusion    Syncope 09/25/2015   Normocytic anemia 09/25/2015   Occipital scalp laceration 09/25/2015   Sinus bradycardia 07/16/2014   Dyslipidemia 08/01/2013   Elevated coronary artery calcium score 08/01/2013   Social History   Socioeconomic History   Marital status: Married    Spouse name: Not on file   Number of children: 2   Years of education: 16   Highest education level: Not on file  Occupational History   Occupation: 2  Tobacco Use   Smoking status: Former   Smokeless tobacco: Former    Types: Snuff    Quit date: 10/01/1986   Tobacco comments:    inconsistent snuff for 2 years and quit  Vaping Use   Vaping Use: Former  Substance and Sexual Activity   Alcohol use: Yes    Alcohol/week: 10.0 standard drinks of alcohol    Types: 10 Cans of beer per week  Drug use: No   Sexual activity: Not on file  Other Topics Concern   Not on file  Social History Narrative   Not on file   Social Determinants of Health   Financial Resource Strain: Not on file  Food Insecurity: Not on file  Transportation Needs: Not on file  Physical Activity: Not on file  Stress: Not on file  Social Connections: Not on file  Intimate Partner Violence: Not on file    [name]'s family history includes Cancer in his father and mother; Esophageal cancer in his maternal aunt; Heart Problems in his maternal grandmother.      Objective:    Vitals:   09/14/22 0839  BP: 90/60  Pulse: 64     Physical Exam Well-developed well-nourished  WM  in no acute distress.  Height, Weight,171  BMI 23.4   HEENT; nontraumatic normocephalic, EOMI, PE R LA, sclera anicteric. Oropharynx; not examined Neck; supple, no JVD Cardiovascular; regular rate and rhythm with S1-S2, no murmur rub or gallop Pulmonary; Clear bilaterally Abdomen; soft, nontender, nondistended, no palpable mass or hepatosplenomegaly, bowel sounds are active Rectal;no external hemorrhoids or lesions, on anoscopy there is one internal hemorrhoid, no fissure  or other abnormality, hemorrhoids soft on digital exam  Skin; benign exam, no jaundice rash or appreciable lesions Extremities; no clubbing cyanosis or edema skin warm and dry Neuro/Psych; alert and oriented x4, grossly nonfocal mood and affect appropriate        Assessment & Plan:   #59 67 year old white male, with anal rectal bump noted over the past couple of months, asymptomatic, noting only with wiping with bowel movements.  No external lesions present, on anoscopy he does have 1 internal hemorrhoid noted which is palpable on digital exam and soft.  #2 history of internal hemorrhoids status post prior hemorrhoidal banding #3.  Diverticulosis #4.  Prior history of adenomatous colon polyps.  2011, Last colonoscopy June 2022 with poor prep and at that time slated for 4-year interval follow-up.  Plan; patient reassured that nothing found on exam other than an internal hemorrhoid.  He is encouraged to come back for evaluation should he have any change or progression in any symptoms. Otherwise will be indicated for colonoscopy June 2026 with Dr. Lavon Paganini .   Tabytha Gradillas Oswald Hillock PA-C 09/14/2022   Cc: Rodrigo Ran, MD

## 2022-09-14 NOTE — Patient Instructions (Signed)
_______________________________________________________  If your blood pressure at your visit was 140/90 or greater, please contact your primary care physician to follow up on this. _______________________________________________________  If you are age 65 or older, your body mass index should be between 23-30. Your Body mass index is 23.4 kg/m. If this is out of the aforementioned range listed, please consider follow up with your Primary Care Provider. ________________________________________________________  The [facility] providers would like to encourage you to use [facility] to communicate with providers for non-urgent requests or questions.  Due to long hold times on the telephone, sending your provider a message by [facility] may be a faster and more efficient way to get a response.  Please allow 48 business hours for a response.  Please remember that this is for non-urgent requests.  _______________________________________________________  Bonita Quin will need a colonoscopy in 2026. We will contact you to schedule this appointment.  You will follow up with Dr Lavon Paganini on an as needed basis.  Thank you for entrusting me with your care and choosing [facility].  Amy Esterwood, PA-C

## 2022-11-03 ENCOUNTER — Ambulatory Visit: Payer: Self-pay | Admitting: Gastroenterology

## 2023-08-10 ENCOUNTER — Encounter: Payer: Self-pay | Admitting: Orthopaedic Surgery

## 2023-08-10 ENCOUNTER — Other Ambulatory Visit (INDEPENDENT_AMBULATORY_CARE_PROVIDER_SITE_OTHER): Payer: Self-pay

## 2023-08-10 ENCOUNTER — Ambulatory Visit: Admitting: Orthopaedic Surgery

## 2023-08-10 DIAGNOSIS — M25521 Pain in right elbow: Secondary | ICD-10-CM

## 2023-08-10 NOTE — Progress Notes (Signed)
 The patient is a 68 year old right hand dominant gentleman that I am seeing for his right elbow today.  He is listed as new patient close I have not seen him in a very long period time but I do remember him.  He comes in with right elbow pain has been hurting since earlier this year.  It hurts mainly with activities day living such as shaving.  He hurts on the lateral aspect of his right elbow with no known trauma.  He denies any numbness and tingling.  He says is not a constant pain and we made this appointment 3 weeks ago he said it is feeling somewhat better.  He is not a diabetic.  He is never had injury to the elbow.  He Profen exam his right elbow motion is entirely full and his elbow feels ligamentously stable.  He has a negative Tinel sign over the cubital tunnel.  There is no pain over the medial aspect of the elbow but there is pain over the lateral aspect especially the lateral epicondyle and just distal to this.  X-rays of the right elbow are negative.  The joint spaces well-maintained and the alignment is normal.  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  He will try Voltaren gel.  He does have a steroid taper at home that he was going to take 1 time for root canal and I told him he could certainly take this and try it for his elbow.  Have also recommended activity modification and showed him some stretching.  If this does not improve recommend he come back for steroid injection.  All question concerns were answered and addressed.  Agrees with the treatment plan.

## 2024-03-26 ENCOUNTER — Other Ambulatory Visit: Payer: Self-pay | Admitting: Urology

## 2024-03-26 DIAGNOSIS — R972 Elevated prostate specific antigen [PSA]: Secondary | ICD-10-CM

## 2024-03-27 ENCOUNTER — Encounter: Payer: Self-pay | Admitting: Urology

## 2024-03-27 ENCOUNTER — Other Ambulatory Visit: Payer: Self-pay | Admitting: Urology

## 2024-03-27 DIAGNOSIS — R972 Elevated prostate specific antigen [PSA]: Secondary | ICD-10-CM

## 2024-03-30 ENCOUNTER — Ambulatory Visit
Admission: RE | Admit: 2024-03-30 | Discharge: 2024-03-30 | Disposition: A | Source: Ambulatory Visit | Attending: Urology

## 2024-03-30 DIAGNOSIS — R972 Elevated prostate specific antigen [PSA]: Secondary | ICD-10-CM

## 2024-04-10 ENCOUNTER — Inpatient Hospital Stay: Admission: RE | Admit: 2024-04-10 | Discharge: 2024-04-10 | Attending: Urology

## 2024-04-10 DIAGNOSIS — R972 Elevated prostate specific antigen [PSA]: Secondary | ICD-10-CM

## 2024-04-10 MED ORDER — GADOPICLENOL 0.5 MMOL/ML IV SOLN
10.0000 mL | Freq: Once | INTRAVENOUS | Status: AC | PRN
  Administered 2024-04-10: 14:00:00 8 mL via INTRAVENOUS
